# Patient Record
Sex: Female | Born: 1970
Health system: Southern US, Community
[De-identification: ages and names within clinical notes are randomized; demographics above are authoritative.]

## PROBLEM LIST (undated history)

## (undated) DIAGNOSIS — I10 Essential (primary) hypertension: Secondary | ICD-10-CM

## (undated) DIAGNOSIS — M25519 Pain in unspecified shoulder: Secondary | ICD-10-CM

## (undated) DIAGNOSIS — E7800 Pure hypercholesterolemia, unspecified: Secondary | ICD-10-CM

## (undated) DIAGNOSIS — N920 Excessive and frequent menstruation with regular cycle: Secondary | ICD-10-CM

## (undated) DIAGNOSIS — M75121 Complete rotator cuff tear or rupture of right shoulder, not specified as traumatic: Secondary | ICD-10-CM

## (undated) DIAGNOSIS — E669 Obesity, unspecified: Secondary | ICD-10-CM

## (undated) DIAGNOSIS — E785 Hyperlipidemia, unspecified: Secondary | ICD-10-CM

## (undated) HISTORY — DX: Pain in unspecified shoulder: M25.519

## (undated) HISTORY — PX: TUBAL LIGATION: SHX77

## (undated) HISTORY — DX: Obesity, unspecified: E66.9

## (undated) HISTORY — DX: Excessive and frequent menstruation with regular cycle: N92.0

## (undated) HISTORY — PX: ABLATION: SHX5711

## (undated) HISTORY — DX: Hyperlipidemia, unspecified: E78.5

## (undated) HISTORY — PX: ROTATOR CUFF REPAIR: SHX139

---

## 1998-05-29 ENCOUNTER — Ambulatory Visit (HOSPITAL_COMMUNITY): Admission: RE | Admit: 1998-05-29 | Discharge: 1998-05-29 | Payer: Self-pay | Admitting: Obstetrics & Gynecology

## 1998-09-16 ENCOUNTER — Inpatient Hospital Stay (HOSPITAL_COMMUNITY): Admission: AD | Admit: 1998-09-16 | Discharge: 1998-09-16 | Payer: Self-pay | Admitting: *Deleted

## 1998-11-06 ENCOUNTER — Encounter (INDEPENDENT_AMBULATORY_CARE_PROVIDER_SITE_OTHER): Payer: Self-pay

## 1998-11-06 ENCOUNTER — Inpatient Hospital Stay (HOSPITAL_COMMUNITY): Admission: AD | Admit: 1998-11-06 | Discharge: 1998-11-08 | Payer: Self-pay | Admitting: Obstetrics & Gynecology

## 1999-09-23 ENCOUNTER — Other Ambulatory Visit: Admission: RE | Admit: 1999-09-23 | Discharge: 1999-09-23 | Payer: Self-pay | Admitting: Obstetrics and Gynecology

## 2000-01-09 ENCOUNTER — Emergency Department (HOSPITAL_COMMUNITY): Admission: EM | Admit: 2000-01-09 | Discharge: 2000-01-09 | Payer: Self-pay | Admitting: Emergency Medicine

## 2003-05-10 ENCOUNTER — Other Ambulatory Visit: Admission: RE | Admit: 2003-05-10 | Discharge: 2003-05-10 | Payer: Self-pay | Admitting: Family Medicine

## 2005-06-16 ENCOUNTER — Ambulatory Visit: Payer: Self-pay | Admitting: Family Medicine

## 2005-06-23 ENCOUNTER — Ambulatory Visit (HOSPITAL_COMMUNITY): Admission: RE | Admit: 2005-06-23 | Discharge: 2005-06-23 | Payer: Self-pay | Admitting: Family Medicine

## 2005-07-08 ENCOUNTER — Ambulatory Visit: Payer: Self-pay | Admitting: Family Medicine

## 2006-05-18 ENCOUNTER — Ambulatory Visit (HOSPITAL_COMMUNITY): Admission: RE | Admit: 2006-05-18 | Discharge: 2006-05-18 | Payer: Self-pay | Admitting: Obstetrics & Gynecology

## 2006-06-10 ENCOUNTER — Ambulatory Visit (HOSPITAL_COMMUNITY): Admission: RE | Admit: 2006-06-10 | Discharge: 2006-06-10 | Payer: Self-pay | Admitting: Obstetrics & Gynecology

## 2006-06-10 ENCOUNTER — Encounter: Payer: Self-pay | Admitting: Obstetrics & Gynecology

## 2006-07-14 ENCOUNTER — Ambulatory Visit (HOSPITAL_COMMUNITY): Admission: RE | Admit: 2006-07-14 | Discharge: 2006-07-14 | Payer: Self-pay | Admitting: Obstetrics & Gynecology

## 2006-07-20 ENCOUNTER — Encounter: Admission: RE | Admit: 2006-07-20 | Discharge: 2006-07-20 | Payer: Self-pay | Admitting: Obstetrics & Gynecology

## 2007-07-11 ENCOUNTER — Ambulatory Visit (HOSPITAL_COMMUNITY): Admission: RE | Admit: 2007-07-11 | Discharge: 2007-07-11 | Payer: Self-pay | Admitting: Obstetrics & Gynecology

## 2008-01-05 HISTORY — PX: ENDOMETRIAL ABLATION: SHX621

## 2008-12-24 ENCOUNTER — Ambulatory Visit: Payer: Self-pay | Admitting: Family Medicine

## 2008-12-24 ENCOUNTER — Encounter: Payer: Self-pay | Admitting: Family Medicine

## 2008-12-24 DIAGNOSIS — S025XXA Fracture of tooth (traumatic), initial encounter for closed fracture: Secondary | ICD-10-CM | POA: Insufficient documentation

## 2008-12-24 DIAGNOSIS — E669 Obesity, unspecified: Secondary | ICD-10-CM

## 2008-12-24 DIAGNOSIS — H0520 Unspecified exophthalmos: Secondary | ICD-10-CM | POA: Insufficient documentation

## 2008-12-26 ENCOUNTER — Ambulatory Visit (HOSPITAL_COMMUNITY): Admission: RE | Admit: 2008-12-26 | Discharge: 2008-12-26 | Payer: Self-pay | Admitting: Family Medicine

## 2008-12-31 ENCOUNTER — Encounter: Payer: Self-pay | Admitting: Family Medicine

## 2008-12-31 ENCOUNTER — Ambulatory Visit: Payer: Self-pay | Admitting: Family Medicine

## 2008-12-31 LAB — CONVERTED CEMR LAB
CO2: 23 meq/L (ref 19–32)
Calcium: 9.2 mg/dL (ref 8.4–10.5)
Cholesterol: 230 mg/dL — ABNORMAL HIGH (ref 0–200)
Creatinine, Ser: 1.12 mg/dL (ref 0.40–1.20)
HCT: 43.2 % (ref 36.0–46.0)
HDL: 40 mg/dL (ref >39)
MCV: 86.4 fL (ref 78.0–100.0)
Platelets: 283 10*3/uL (ref 150–400)
RDW: 15.2 % (ref 11.5–15.5)
Sodium: 140 meq/L (ref 135–145)
TSH: 0.609 microintl units/mL (ref 0.350–4.500)
Total CHOL/HDL Ratio: 5.8
Triglycerides: 282 mg/dL — ABNORMAL HIGH (ref <150)
WBC: 7.6 10*3/uL (ref 4.0–10.5)

## 2009-01-01 ENCOUNTER — Encounter: Payer: Self-pay | Admitting: Family Medicine

## 2009-01-06 ENCOUNTER — Encounter: Admission: RE | Admit: 2009-01-06 | Discharge: 2009-01-06 | Payer: Self-pay | Admitting: Family Medicine

## 2009-01-24 ENCOUNTER — Ambulatory Visit: Payer: Self-pay | Admitting: Family Medicine

## 2009-01-24 DIAGNOSIS — E781 Pure hyperglyceridemia: Secondary | ICD-10-CM | POA: Insufficient documentation

## 2009-01-29 ENCOUNTER — Encounter: Payer: Self-pay | Admitting: Family Medicine

## 2009-04-17 ENCOUNTER — Emergency Department (HOSPITAL_COMMUNITY): Admission: EM | Admit: 2009-04-17 | Discharge: 2009-04-17 | Payer: Self-pay | Admitting: Emergency Medicine

## 2009-07-07 ENCOUNTER — Emergency Department (HOSPITAL_COMMUNITY): Admission: EM | Admit: 2009-07-07 | Discharge: 2009-07-07 | Payer: Self-pay | Admitting: Emergency Medicine

## 2010-01-25 ENCOUNTER — Encounter: Payer: Self-pay | Admitting: Obstetrics & Gynecology

## 2010-02-03 NOTE — Assessment & Plan Note (Signed)
Summary: fu/kh   Vital Signs:  Patient profile:   40 year old female Height:      66 inches Weight:      201.9 pounds BMI:     32.71 Pulse rate:   80 / minute BP sitting:   130 / 80  (right arm) Cuff size:   large  Vitals Entered By: Arlyss Repress CMA, (January 24, 2009 2:17 PM) CC: BP check, pap Is Patient Diabetic? No Pain Assessment Patient in pain? no        CC:  BP check and pap.  History of Present Illness: Ms. Elrod comes in today for BP check and pap smear and discuss labwork.  C/O neck pain 1) BP - seen as new patient at last visit.  BP slightly high.  Checked one time since that visit and it was 143/88.  Today it is good at 130/80.  Has never had any issues with her BP before.  no headaches, chest pain, shortness of breath. 2) Pap - she was due for pap.  Asked to return for pap and BP check as unable to do during new visit.  1 abnormal pap smear when much younger but never had to go for colpo.  Have always been normal since.  States she had this year's mammogram a week or so ago.  We have no yet received reports 3) Neck pain - was wrestling with her daughter in December and strained her neck.  Overall feeling better but still gets stiffness, spasm at time, usually when trying to sleep or after waking up from sleep.   4) labwork - CBC, BMET, TSH all normal.  Cholesterol borderline high with high triglycerides.  Discussed meanign of this with patient and forms of treatment.   Allergies: 1)  ! Penicillin  Physical Exam  General:  obese, alert, NAD, cooperative Neck:  supple, full ROM, and no masses.   Abdomen:  Bowel sounds positive,abdomen soft and non-tender without masses, organomegaly or hernias noted. Genitalia:  normal introitus, no external lesions, no vaginal discharge, mucosa pink and moist, no vaginal or cervical lesions, no vaginal atrophy, no friaility or hemorrhage, normal uterus size and position, and no adnexal masses or tenderness.     Impression &  Recommendations:  Problem # 1:  ELEVATED BLOOD PRESSURE (ICD-796.2) Assessment Improved NOrmal today.  Will just continue to watch closely.   Problem # 2:  HYPERTRIGLYCERIDEMIA (ICD-272.1) Assessment: New  Discussed cholesterol results, what they mean, how to improve them.  Patient opts to try diet/exercise first.  Will return in 3-4 months to have FLP rechecked.  If still high, would recommend initiating medicine and patient aware and in agreement.   Orders: [facility]- Est  Level 4 (99214)  Problem # 3:  SCREENING FOR MALIGNANT NEOPLASM OF THE CERVIX (ICD-V76.2) Assessment: New Pap obtained.  Orders: Pap Smear-[facility] ([id number]) [facility]- Est  Level 4 (09811) Pap Smear- [facility] (Pap)  Complete Medication List: 1)  Flexeril 5 Mg Tabs (Cyclobenzaprine hcl) .Marland Kitchen.. 1 tab by mouth q 6 hrs as needed neck pain or spasm  Patient Instructions: 1)  It was great to see you again.  Thank you for checking your blood pressure for me.  2)  You blood pressure looks good today. 3)  When I get the results of your pap smear next week I will send you a letter.  4)  I would like to see you again in 3-4 months to recheck your cholesterol and triglycerides again.  If you can come  in 2-3 days before your appointment, in the morning before you eat or drink anything, that would be wonderful.  Then I would have the results for your appointment so we could discuss them.  When you schedule your appointment, just also make a lab appointment for a few days prior.  The lab will have the order for the bloodwork.  5)  Have a great weekend! Prescriptions: FLEXERIL 5 MG TABS (CYCLOBENZAPRINE HCL) 1 tab by mouth q 6 hrs as needed neck pain or spasm  #30 x 1   Entered and Authorized by:   Ardeen Garland  MD   Signed by:   Ardeen Garland  MD on 01/24/2009   Method used:   Print then Give to Patient   RxID:   [id number]

## 2010-02-03 NOTE — Letter (Signed)
Summary: Generic Letter  [facility]  [address]   [place], [place] [address]   Phone: [phone]  Fax: [phone]    01/29/2009  [facility] [address] [place], [place]  [address]  Dear Ms. Migliaccio,   I am happy to inform you that your recent pap smear was normal. If you have any questions, please call our office.         Sincerely,   Ardeen Garland  MD  Appended Document: Generic Letter mailed.

## 2010-02-19 ENCOUNTER — Encounter: Payer: Self-pay | Admitting: *Deleted

## 2010-05-19 NOTE — Op Note (Signed)
Audrey Taylor, Audrey Taylor                 ACCOUNT NO.:  [id number]   MEDICAL RECORD NO.:  [id number]          PATIENT TYPE:  AMB   LOCATION:  [facility]                           FACILITY:  [facility]   PHYSICIAN:  Roseanna Rainbow, M.D.DATE OF BIRTH:  Jan 19, 1970   DATE OF PROCEDURE:  06/10/2006  DATE OF DISCHARGE:                               OPERATIVE REPORT   PREOPERATIVE DIAGNOSIS:  Menorrhagia.   POSTOPERATIVE DIAGNOSIS:  Menorrhagia.  Rule out endometrial polyps  versus hyperplasia.   PROCEDURE:  Dilatation and curettage, diagnostic hysteroscopy and  NovaSure ablation.   SURGEON:  Jackson-Moore.   ANESTHESIA:  General endotracheal.   PATHOLOGY:  Endometrial curettings.   COMPLICATIONS:  None.   FINDINGS:  Upon hysteroscopy, the visualization was limited by a very  lush appearing endometrium.  No discrete lesions were noted.   PROCEDURE:  The patient was taken to the operating room with an IV  running.  She was given general anesthesia and placed in the dorsal  lithotomy position.  She was prepped and draped in the usual sterile  fashion.  A single-tooth tenaculum was then applied to the anterior lip  of the cervix.  The uterus was sounded to 9 cm.  The cervix was dilated  with Shawnie Pons dilators.  The cervical length was 4 cm.  The diagnostic  hysteroscopy was then performed at this time with the above findings  noted.  The hysteroscope was then removed.  The NovaSure device was then  advanced into the uterus and seated.  The intracornual width was noted  to be 4.6.  The intracavitary integrity test was then performed and  passed.  The ablation cycle was then started and completed.  The device  was then removed.  A repeat hysteroscopy post procedure was consistent  with a complete ablation of all of the walls.  The hysteroscope was then  removed.  The single-tooth tenaculum was removed with minimal bleeding  noted from the cervix.  There was no significant deficit noted with  hysteroscopic portion of the procedure.  The patient was then awakened  from general anesthesia and taken to the PACU awake and in stable  condition.      Roseanna Rainbow, M.D.  Electronically Signed     LAJ/MEDQ  D:  06/10/2006  T:  06/10/2006  Job:  [id number]

## 2010-05-19 NOTE — H&P (Signed)
NAMEPOLETTE, Audrey Taylor                 ACCOUNT NO.:  [id number]   MEDICAL RECORD NO.:  [id number]          PATIENT TYPE:  AMB   LOCATION:  SDC                           [facility]   PHYSICIAN:  Roseanna Rainbow, M.D.DATE OF BIRTH:  [birth year]   DATE OF ADMISSION:  DATE OF DISCHARGE:                              HISTORY & PHYSICAL   CHIEF COMPLAINT:  The patient is a 40 year old complaining of abnormal  uterine bleeding, who presents for a D&C and NovaSure ablation.   HISTORY OF PRESENT ILLNESS:  The patient gives a 52-month history of  heavier bleeding with menses and clotting.  She denies any previous  diagnosis of uterine pathology.  Work-up to date has included a normal  pelvic ultrasound.   PAST MEDICAL HISTORY:  Noncontributory.   SOCIAL HISTORY:  Married.  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  Has no  significant smoking history.  Does not give any significant history of  alcohol usage.  Denies illicit drug use.   FAMILY HISTORY:  She denies.   PAST GYN HISTORY:  She has a history of a bilateral tubal ligation  postpartum.   PAST OBSTETRICAL HISTORY:  She has been pregnant 2 times.  She has 2  living children.   REVIEW OF SYSTEMS:  GU:  Please see the above.   PHYSICAL EXAMINATION:  VITAL SIGNS:  Temperature 97.8, pulse 77, blood  pressure 148/74, weight 196 pounds.  GENERAL:  Well-developed, well-nourished female, no apparent distress.  LUNGS:  Clear to auscultation bilaterally.  HEART:  Regular rate and rhythm.  ABDOMEN:  Soft, nontender without masses.  PELVIC:  The external female genitalia are normal-appearing on speculum  exam.  The vaginal mucosa is clear without lesions, no discharge.  The  cervix is clear, firm and closed.  No visible lesions.  On bimanual  exam, the uterus is mildly enlarged.  The position is retroverted.  The  adnexa are without masses or organomegaly or local guarding.   ASSESSMENT:  Menorrhagia.   PLAN:  The planned procedure is a D&C  and NovaSure ablation.  The risks,  benefits, and alternative forms of management were reviewed with the  patient, and informed consent had been obtained.      Roseanna Rainbow, M.D.  Electronically Signed     LAJ/MEDQ  D:  06/09/2006  T:  06/09/2006  Job:  [id number]

## 2010-10-22 LAB — CBC
HCT: 40.8
Hemoglobin: 13.4
MCHC: 32.8
MCV: 82.7
RBC: 4.94
RDW: 15.7 — ABNORMAL HIGH

## 2011-01-20 ENCOUNTER — Ambulatory Visit: Payer: Self-pay

## 2011-05-28 ENCOUNTER — Encounter (HOSPITAL_COMMUNITY): Payer: Self-pay | Admitting: Anesthesiology

## 2011-05-28 ENCOUNTER — Emergency Department (INDEPENDENT_AMBULATORY_CARE_PROVIDER_SITE_OTHER)
Admission: EM | Admit: 2011-05-28 | Discharge: 2011-05-28 | Disposition: A | Discharge status: Nursing Home (Includes ICF) | Payer: 59 | Source: Home / Self Care | Attending: Family Medicine | Admitting: Family Medicine

## 2011-05-28 ENCOUNTER — Emergency Department (INDEPENDENT_AMBULATORY_CARE_PROVIDER_SITE_OTHER): Payer: 59

## 2011-05-28 ENCOUNTER — Encounter (HOSPITAL_COMMUNITY): Payer: Self-pay | Admitting: *Deleted

## 2011-05-28 ENCOUNTER — Emergency Department (HOSPITAL_COMMUNITY)
Admission: EM | Admit: 2011-05-28 | Discharge: 2011-05-28 | Disposition: A | Discharge status: Home / Self Care | Payer: 59 | Attending: Emergency Medicine | Admitting: Emergency Medicine

## 2011-05-28 ENCOUNTER — Encounter (HOSPITAL_COMMUNITY): Payer: Self-pay | Admitting: Emergency Medicine

## 2011-05-28 ENCOUNTER — Encounter (HOSPITAL_COMMUNITY)
Admission: EM | Disposition: A | Discharge status: Home / Self Care | Payer: Self-pay | Source: Home / Self Care | Attending: Emergency Medicine

## 2011-05-28 ENCOUNTER — Emergency Department (HOSPITAL_COMMUNITY): Payer: 59 | Admitting: Anesthesiology

## 2011-05-28 DIAGNOSIS — W319XXA Contact with unspecified machinery, initial encounter: Secondary | ICD-10-CM | POA: Insufficient documentation

## 2011-05-28 DIAGNOSIS — T23269A Burn of second degree of back of unspecified hand, initial encounter: Secondary | ICD-10-CM

## 2011-05-28 DIAGNOSIS — S62639B Displaced fracture of distal phalanx of unspecified finger, initial encounter for open fracture: Secondary | ICD-10-CM

## 2011-05-28 DIAGNOSIS — S61209A Unspecified open wound of unspecified finger without damage to nail, initial encounter: Secondary | ICD-10-CM | POA: Insufficient documentation

## 2011-05-28 DIAGNOSIS — S6710XA Crushing injury of unspecified finger(s), initial encounter: Secondary | ICD-10-CM | POA: Insufficient documentation

## 2011-05-28 DIAGNOSIS — Y990 Civilian activity done for income or pay: Secondary | ICD-10-CM | POA: Insufficient documentation

## 2011-05-28 DIAGNOSIS — M7989 Other specified soft tissue disorders: Secondary | ICD-10-CM | POA: Insufficient documentation

## 2011-05-28 DIAGNOSIS — T23062A Burn of unspecified degree of back of left hand, initial encounter: Secondary | ICD-10-CM

## 2011-05-28 DIAGNOSIS — Z23 Encounter for immunization: Secondary | ICD-10-CM

## 2011-05-28 DIAGNOSIS — S6000XA Contusion of unspecified finger without damage to nail, initial encounter: Secondary | ICD-10-CM | POA: Insufficient documentation

## 2011-05-28 DIAGNOSIS — M79609 Pain in unspecified limb: Secondary | ICD-10-CM | POA: Insufficient documentation

## 2011-05-28 DIAGNOSIS — T23069A Burn of unspecified degree of back of unspecified hand, initial encounter: Secondary | ICD-10-CM

## 2011-05-28 HISTORY — PX: I&D EXTREMITY: SHX5045

## 2011-05-28 SURGERY — IRRIGATION AND DEBRIDEMENT EXTREMITY
Anesthesia: General | Site: Finger | Laterality: Right | Wound class: Dirty or Infected

## 2011-05-28 MED ORDER — SUCCINYLCHOLINE CHLORIDE 20 MG/ML IJ SOLN
INTRAMUSCULAR | Status: DC | PRN
  Administered 2011-05-28: 150 mg via INTRAVENOUS

## 2011-05-28 MED ORDER — ONDANSETRON HCL 4 MG/2ML IJ SOLN
4.0000 mg | Freq: Once | INTRAMUSCULAR | Status: AC
  Administered 2011-05-28: 4 mg via INTRAMUSCULAR

## 2011-05-28 MED ORDER — HYDROMORPHONE HCL PF 1 MG/ML IJ SOLN
INTRAMUSCULAR | Status: AC
  Filled 2011-05-28: qty 2

## 2011-05-28 MED ORDER — FENTANYL CITRATE 0.05 MG/ML IJ SOLN
INTRAMUSCULAR | Status: DC | PRN
  Administered 2011-05-28 (×2): 100 ug via INTRAVENOUS

## 2011-05-28 MED ORDER — LIDOCAINE HCL (CARDIAC) 20 MG/ML IV SOLN
INTRAVENOUS | Status: DC | PRN
  Administered 2011-05-28: 100 mg via INTRAVENOUS

## 2011-05-28 MED ORDER — HYDROMORPHONE HCL PF 1 MG/ML IJ SOLN
2.0000 mg | Freq: Once | INTRAMUSCULAR | Status: AC
  Administered 2011-05-28: 2 mg via INTRAMUSCULAR

## 2011-05-28 MED ORDER — ONDANSETRON HCL 4 MG/2ML IJ SOLN: 4.0000 mg | Freq: Once | INTRAMUSCULAR | Status: DC | PRN

## 2011-05-28 MED ORDER — TETANUS-DIPHTH-ACELL PERTUSSIS 5-2.5-18.5 LF-MCG/0.5 IM SUSP
INTRAMUSCULAR | Status: AC
  Filled 2011-05-28: qty 0.5

## 2011-05-28 MED ORDER — PROPOFOL 10 MG/ML IV EMUL
INTRAVENOUS | Status: DC | PRN
  Administered 2011-05-28: 120 mg via INTRAVENOUS

## 2011-05-28 MED ORDER — TETANUS-DIPHTH-ACELL PERTUSSIS 5-2.5-18.5 LF-MCG/0.5 IM SUSP
0.5000 mL | Freq: Once | INTRAMUSCULAR | Status: AC
  Administered 2011-05-28: 0.5 mL via INTRAMUSCULAR

## 2011-05-28 MED ORDER — SODIUM CHLORIDE 0.9 % IR SOLN
Status: DC | PRN
  Administered 2011-05-28: 1000 mL

## 2011-05-28 MED ORDER — HYDROMORPHONE HCL PF 1 MG/ML IJ SOLN
INTRAMUSCULAR | Status: AC
  Filled 2011-05-28: qty 1

## 2011-05-28 MED ORDER — LACTATED RINGERS IV SOLN
INTRAVENOUS | Status: DC | PRN
  Administered 2011-05-28: 20:00:00 via INTRAVENOUS

## 2011-05-28 MED ORDER — HYDROMORPHONE HCL PF 1 MG/ML IJ SOLN
0.2500 mg | INTRAMUSCULAR | Status: DC | PRN
  Administered 2011-05-28: 0.5 mg via INTRAVENOUS

## 2011-05-28 MED ORDER — ONDANSETRON HCL 4 MG/2ML IJ SOLN
INTRAMUSCULAR | Status: DC | PRN
  Administered 2011-05-28: 4 mg via INTRAVENOUS

## 2011-05-28 MED ORDER — CHLORHEXIDINE GLUCONATE 4 % EX LIQD
60.0000 mL | Freq: Once | CUTANEOUS | Status: DC
  Filled 2011-05-28: qty 60

## 2011-05-28 MED ORDER — ONDANSETRON HCL 4 MG/2ML IJ SOLN
INTRAMUSCULAR | Status: AC
  Filled 2011-05-28: qty 2

## 2011-05-28 MED ORDER — CEFAZOLIN SODIUM 1-5 GM-% IV SOLN
1.0000 g | INTRAVENOUS | Status: AC
  Administered 2011-05-28: 1 g via INTRAVENOUS
  Filled 2011-05-28: qty 50

## 2011-05-28 MED ORDER — BUPIVACAINE HCL 0.25 % IJ SOLN
INTRAMUSCULAR | Status: DC | PRN
  Administered 2011-05-28: 3 mL

## 2011-05-28 MED ORDER — OXYCODONE-ACETAMINOPHEN 5-325 MG PO TABS: 1.0000 | ORAL_TABLET | ORAL | Status: AC | PRN

## 2011-05-28 MED ORDER — MIDAZOLAM HCL 5 MG/5ML IJ SOLN
INTRAMUSCULAR | Status: DC | PRN
  Administered 2011-05-28: 2 mg via INTRAVENOUS

## 2011-05-28 SURGICAL SUPPLY — 51 items
BANDAGE CONFORM 2  STR LF (GAUZE/BANDAGES/DRESSINGS) IMPLANT
BANDAGE ELASTIC 3 VELCRO ST LF (GAUZE/BANDAGES/DRESSINGS) IMPLANT
BANDAGE ELASTIC 4 VELCRO ST LF (GAUZE/BANDAGES/DRESSINGS) IMPLANT
BANDAGE GAUZE ELAST BULKY 4 IN (GAUZE/BANDAGES/DRESSINGS) IMPLANT
BNDG CMPR 9X4 STRL LF SNTH (GAUZE/BANDAGES/DRESSINGS) ×1
BNDG COHESIVE 1X5 TAN STRL LF (GAUZE/BANDAGES/DRESSINGS) ×2 IMPLANT
BNDG ESMARK 4X9 LF (GAUZE/BANDAGES/DRESSINGS) ×2 IMPLANT
CLOTH BEACON ORANGE TIMEOUT ST (SAFETY) ×2 IMPLANT
CORDS BIPOLAR (ELECTRODE) IMPLANT
COVER SURGICAL LIGHT HANDLE (MISCELLANEOUS) ×2 IMPLANT
CUFF TOURNIQUET SINGLE 18IN (TOURNIQUET CUFF) ×2 IMPLANT
DECANTER SPIKE VIAL GLASS SM (MISCELLANEOUS) IMPLANT
DRAIN PENROSE 1/4X12 LTX STRL (WOUND CARE) IMPLANT
DRAPE OEC MINIVIEW 54X84 (DRAPES) IMPLANT
DRAPE SURG 17X23 STRL (DRAPES) ×2 IMPLANT
DRSG PAD ABDOMINAL 8X10 ST (GAUZE/BANDAGES/DRESSINGS) IMPLANT
DURAPREP 26ML APPLICATOR (WOUND CARE) IMPLANT
ELECT REM PT RETURN 9FT ADLT (ELECTROSURGICAL)
ELECTRODE REM PT RTRN 9FT ADLT (ELECTROSURGICAL) IMPLANT
GAUZE PACKING IODOFORM 1/4X5 (PACKING) IMPLANT
GAUZE XEROFORM 1X8 LF (GAUZE/BANDAGES/DRESSINGS) ×2 IMPLANT
GLOVE BIO SURGEON STRL SZ8.5 (GLOVE) ×2 IMPLANT
GOWN SRG XL XLNG 56XLVL 4 (GOWN DISPOSABLE) IMPLANT
GOWN STRL NON-REIN LRG LVL3 (GOWN DISPOSABLE) IMPLANT
GOWN STRL NON-REIN XL XLG LVL4 (GOWN DISPOSABLE)
HANDPIECE INTERPULSE COAX TIP (DISPOSABLE)
KIT BASIN OR (CUSTOM PROCEDURE TRAY) ×2 IMPLANT
KIT ROOM TURNOVER OR (KITS) ×2 IMPLANT
MANIFOLD NEPTUNE II (INSTRUMENTS) ×2 IMPLANT
NEEDLE HYPO 25GX1X1/2 BEV (NEEDLE) ×2 IMPLANT
NEEDLE HYPO 25X1 1.5 SAFETY (NEEDLE) ×2 IMPLANT
NS IRRIG 1000ML POUR BTL (IV SOLUTION) ×2 IMPLANT
PACK ORTHO EXTREMITY (CUSTOM PROCEDURE TRAY) ×2 IMPLANT
PAD ARMBOARD 7.5X6 YLW CONV (MISCELLANEOUS) ×4 IMPLANT
PAD CAST 4YDX4 CTTN HI CHSV (CAST SUPPLIES) IMPLANT
PADDING CAST COTTON 4X4 STRL (CAST SUPPLIES)
SET HNDPC FAN SPRY TIP SCT (DISPOSABLE) IMPLANT
SPONGE GAUZE 4X4 12PLY (GAUZE/BANDAGES/DRESSINGS) ×2 IMPLANT
SPONGE LAP 18X18 X RAY DECT (DISPOSABLE) IMPLANT
SUCTION FRAZIER TIP 10 FR DISP (SUCTIONS) IMPLANT
SUT CHROMIC 6 0 PS 4 (SUTURE) ×2 IMPLANT
SUT VICRYL RAPIDE 4/0 PS 2 (SUTURE) ×2 IMPLANT
SYR 20CC LL (SYRINGE) IMPLANT
SYR CONTROL 10ML LL (SYRINGE) ×2 IMPLANT
TOWEL OR 17X24 6PK STRL BLUE (TOWEL DISPOSABLE) IMPLANT
TOWEL OR 17X26 10 PK STRL BLUE (TOWEL DISPOSABLE) IMPLANT
TUBE ANAEROBIC SPECIMEN COL (MISCELLANEOUS) IMPLANT
TUBE CONNECTING 12X1/4 (SUCTIONS) ×2 IMPLANT
UNDERPAD 30X30 INCONTINENT (UNDERPADS AND DIAPERS) ×2 IMPLANT
WATER STERILE IRR 1000ML POUR (IV SOLUTION) IMPLANT
YANKAUER SUCT BULB TIP NO VENT (SUCTIONS) IMPLANT

## 2011-05-28 NOTE — Brief Op Note (Signed)
05/28/2011  8:47 PM  PATIENT:  Audrey Taylor  41 y.o. female  PRE-OPERATIVE DIAGNOSIS:  Laceration of Right index and long finger  POST-OPERATIVE DIAGNOSIS:  Laceration Right Index and Long Fingers  PROCEDURE:  Procedure(s) (LRB): IRRIGATION AND DEBRIDEMENT EXTREMITY (Right)  SURGEON:  Surgeon(s) and Role:    * Marlowe Shores, MD - Primary  PHYSICIAN ASSISTANT:   ASSISTANTS: none   ANESTHESIA:   general  EBL:  Total I/O In: -  Out: 50 [Other:50]  BLOOD ADMINISTERED:none  DRAINS: none   LOCAL MEDICATIONS USED:  MARCAINE   6cc  SPECIMEN:  No Specimen  DISPOSITION OF SPECIMEN:  N/A  COUNTS:  YES  TOURNIQUET:   Total Tourniquet Time Documented: Upper Arm (Right) - 24 minutes  DICTATION: .Other Dictation: Dictation Number [id number]  PLAN OF CARE: Discharge to home after PACU  PATIENT DISPOSITION:  PACU - hemodynamically stable.   Delay start of Pharmacological VTE agent (>24hrs) due to surgical blood loss or risk of bleeding: not applicable

## 2011-05-28 NOTE — ED Notes (Signed)
Lunch at work is 12-1.  Incident occurred when patient returned to work station from lunch

## 2011-05-28 NOTE — Anesthesia Procedure Notes (Signed)
Procedure Name: Intubation Date/Time: 05/28/2011 8:08 PM Performed by: Jefm Miles E Pre-anesthesia Checklist: Patient identified, Emergency Drugs available, Suction available, Patient being monitored and Timeout performed Patient Re-evaluated:Patient Re-evaluated prior to inductionOxygen Delivery Method: Circle system utilized Preoxygenation: Pre-oxygenation with 100% oxygen Intubation Type: IV induction, Rapid sequence and Cricoid Pressure applied Laryngoscope Size: Mac and 3 Grade View: Grade I Tube type: Oral Tube size: 7.0 mm Number of attempts: 1 Airway Equipment and Method: Stylet Placement Confirmation: ETT inserted through vocal cords under direct vision,  breath sounds checked- equal and bilateral and positive ETCO2 Secured at: 21 cm Tube secured with: Tape Dental Injury: Teeth and Oropharynx as per pre-operative assessment
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
## 2011-05-28 NOTE — Preoperative (Signed)
Beta Blockers   Reason not to administer Beta Blockers:Not Applicable 
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
## 2011-05-28 NOTE — Op Note (Signed)
See dictated note [phone]

## 2011-05-28 NOTE — Anesthesia Preprocedure Evaluation (Addendum)
Anesthesia Evaluation  Patient identified by MRN, date of birth, ID band Patient awake    Reviewed: Allergy & Precautions, H&P , NPO status   Airway Mallampati: II TM Distance: >3 FB Neck ROM: Full    Dental  (+) Teeth Intact and Dental Advisory Given   Pulmonary          Cardiovascular     Neuro/Psych    GI/Hepatic   Endo/Other    Renal/GU      Musculoskeletal   Abdominal   Peds  Hematology   Anesthesia Other Findings   Reproductive/Obstetrics                           Anesthesia Physical Anesthesia Plan  ASA: II  Anesthesia Plan: General   Post-op Pain Management:    Induction: Intravenous  Airway Management Planned: Oral ETT  Additional Equipment:   Intra-op Plan:   Post-operative Plan: Extubation in OR  Informed Consent: I have reviewed the patients History and Physical, chart, labs and discussed the procedure including the risks, benefits and alternatives for the proposed anesthesia with the patient or authorized representative who has indicated his/her understanding and acceptance.     Plan Discussed with: CRNA and Surgeon  Anesthesia Plan Comments:         Anesthesia Quick Evaluation
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
## 2011-05-28 NOTE — Transfer of Care (Signed)
Immediate Anesthesia Transfer of Care Note  Patient: Audrey Taylor  Procedure(s) Performed: Procedure(s) (LRB): IRRIGATION AND DEBRIDEMENT EXTREMITY (Right)  Patient Location: PACU  Anesthesia Type: General  Level of Consciousness: patient cooperative, lethargic and responds to stimulation  Airway & Oxygen Therapy: Patient Spontanous Breathing and Patient connected to nasal cannula oxygen  Post-op Assessment: Report given to PACU RN  Post vital signs: Reviewed and stable  Complications: No apparent anesthesia complications
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
## 2011-05-28 NOTE — ED Notes (Signed)
Hand surgeon came to see the patient

## 2011-05-28 NOTE — Discharge Instructions (Signed)
Finger Fracture Fractures of fingers are breaks in the bones of the fingers. There are many types of fractures. There are different ways of treating these fractures, all of which can be correct. Your caregiver will discuss the best way to treat your fracture. TREATMENT  Finger fractures can be treated with:   Non-reduction - this means the bones are in place. The finger is splinted without changing the positions of the bone pieces. The splint is usually left on for about a week to ten days. This will depend on your fracture and what your caregiver thinks.   Closed reduction - the bones are put back into position without using surgery. The finger is then splinted.   ORIF (open reduction and internal fixation) - the fracture site is opened. Then the bone pieces are fixed into place with pins or some type of hardware. This is seldom required. It depends on the severity of the fracture.  Your caregiver will discuss the type of fracture you have and the treatment that will be best for that problem. If surgery is the treatment of choice, the following is information for you to know and also let your caregiver know about prior to surgery. LET YOUR CAREGIVER KNOW ABOUT:  Allergies   Medications taken including herbs, eye drops, over the counter medications, and creams   Use of steroids (by mouth or creams)   Previous problems with anesthetics or Novocaine   Possibility of pregnancy, if this applies   History of blood clots (thrombophlebitis)   History of bleeding or blood problems   Previous surgery   Other health problems  AFTER THE PROCEDURE After surgery, you will be taken to the recovery area where a nurse will check your progress. Once you're awake, stable, and taking fluids well, barring other problems you will be allowed to go home. Once home an ice pack applied to your operative site may help with discomfort and keep the swelling down. HOME CARE INSTRUCTIONS   Follow your  caregiver's instructions as to activities, exercises, physical therapy, and driving a car.   Use your finger and exercise as directed.   Only take over-the-counter or prescription medicines for pain, discomfort, or fever as directed by your caregiver. Do not take aspirin until your caregiver OK's it, as this can increase bleeding immediately following surgery.   Stop using ibuprofen if it upsets your stomach. Let your caregiver know about it.  SEEK MEDICAL CARE IF:  You have increased bleeding (more than a small spot) from the wound or from beneath your splint.   You develop redness, swelling, or increasing pain in the wound or from beneath your splint.   There is pus coming from the wound or from beneath your splint.   An unexplained oral temperature above 102 F (38.9 C) develops, or as your caregiver suggests.   There is a foul smell coming from the wound or dressing or from beneath your splint.  SEEK IMMEDIATE MEDICAL CARE IF:   You develop a rash.   You have difficulty breathing.   You have any allergic problems.  MAKE SURE YOU:   Understand these instructions.   Will watch your condition.   Will get help right away if you are not doing well or get worse.  Document Released: 04/04/2000 Document Revised: 12/10/2010 Document Reviewed: 08/10/2007 Exit[employer] Patient Information 2012 [employer]. 

## 2011-05-28 NOTE — ED Notes (Signed)
[employer] is employer

## 2011-05-28 NOTE — Anesthesia Postprocedure Evaluation (Signed)
Anesthesia Post Note  Patient: Audrey Taylor  Procedure(s) Performed: Procedure(s) (LRB): IRRIGATION AND DEBRIDEMENT EXTREMITY (Right)  Anesthesia type: general  Patient location: PACU  Post pain: Pain level controlled  Post assessment: Patient's Cardiovascular Status Stable  Last Vitals:  Filed Vitals:   05/28/11 2100  BP:   Pulse: 85  Temp: 36.1 C  Resp: 21    Post vital signs: Reviewed and stable  Level of consciousness: sedated  Complications: No apparent anesthesia complications

## 2011-05-31 MED FILL — Vancomycin HCl For IV Soln 1 GM (Base Equivalent): INTRAVENOUS | Qty: 1000 | Status: AC

## 2011-06-01 ENCOUNTER — Encounter (HOSPITAL_COMMUNITY): Payer: Self-pay | Admitting: Orthopedic Surgery

## 2011-06-01 NOTE — Op Note (Signed)
Audrey Taylor, Audrey Taylor                 ACCOUNT NO.:  [id number]  MEDICAL RECORD NO.:  [id number]  LOCATION:                                 FACILITY:  PHYSICIAN:  Artist Pais. Trebor Galdamez, M.D.DATE OF BIRTH:  09-Jul-1970  DATE OF PROCEDURE:  05/28/2011 DATE OF DISCHARGE:                              OPERATIVE REPORT   PREOPERATIVE DIAGNOSIS:  Crush injury, right index and right long finger.  POSTOPERATIVE DIAGNOSIS:  Crush injury, right index and right long finger.  PROCEDURE:  I and D above with repair of nail bed.  Open distal phalangeal fracture and extensor tendon laceration, right long finger as well as closed treatment of distal phalangeal fracture, right index finger.  SURGEON:  Artist Pais. Mina Marble, MD  ASSISTANT:  None.  ANESTHESIA:  General.  No complication and drains.  The patient was taken to the operating suite.  After induction of general anesthesia, right upper extremity was prepped and draped in sterile fashion.  An Esmarch was used to exsanguinate the limb. Tourniquet was inflated to 250 mmHg.  At this point in time, right upper extremity was approached surgically.  There was a transverse nail plate fracture of the right index finger with a nail bed underneath had minimal subungual hematoma.  This was irrigated and the long finger was then approached.  There was a avulsion of the nail plate from under the eponychial fold, and a complex nail bed laceration, open distal phalangeal fracture, and partial laceration to the extensor tendon.  The wound was thoroughly irrigated.  Debrided of clot.  The skin was repaired with 4-0 Vicryl Rapide to realign the tip followed by repair of the complex nail bed laceration and open treatment of the distal phalangeal fracture.  This was accomplished with 6-0 chromic.  The nail plate was then placed back under the eponychial fold and sutured in place with a 4-0 Vicryl Rapide suture.  The extensor tendon was repaired with 4-0 Vicryl  Rapide as well.  At this point in time, a Xeroform, 4x4s, and Coban wrap was applied to both fingers.  The patient tolerated procedures well and went to the recovery room in stable fashion.     Artist Pais Mina Marble, M.D.     MAW/MEDQ  D:  05/28/2011  T:  05/29/2011  Job:  [id number]

## 2013-04-03 ENCOUNTER — Ambulatory Visit: Payer: No Typology Code available for payment source | Attending: Internal Medicine

## 2013-05-21 ENCOUNTER — Ambulatory Visit: Payer: No Typology Code available for payment source | Attending: Internal Medicine | Admitting: Internal Medicine

## 2013-05-21 ENCOUNTER — Encounter: Payer: Self-pay | Admitting: Internal Medicine

## 2013-05-21 VITALS — BP 127/83 | HR 81 | Temp 97.9°F | Resp 16 | Ht 67.0 in | Wt 216.6 lb

## 2013-05-21 DIAGNOSIS — R109 Unspecified abdominal pain: Secondary | ICD-10-CM | POA: Insufficient documentation

## 2013-05-21 DIAGNOSIS — R102 Pelvic and perineal pain: Secondary | ICD-10-CM

## 2013-05-21 DIAGNOSIS — Z0000 Encounter for general adult medical examination without abnormal findings: Secondary | ICD-10-CM

## 2013-05-21 DIAGNOSIS — N949 Unspecified condition associated with female genital organs and menstrual cycle: Secondary | ICD-10-CM | POA: Insufficient documentation

## 2013-05-21 DIAGNOSIS — M25519 Pain in unspecified shoulder: Secondary | ICD-10-CM | POA: Insufficient documentation

## 2013-05-21 MED ORDER — MELOXICAM 7.5 MG PO TABS: 7.5000 mg | ORAL_TABLET | Freq: Every day | ORAL | Status: DC

## 2013-05-21 NOTE — Patient Instructions (Signed)
DASH Diet  The DASH diet stands for "Dietary Approaches to Stop Hypertension." It is a healthy eating plan that has been shown to reduce high blood pressure (hypertension) in as little as 14 days, while also possibly providing other significant health benefits. These other health benefits include reducing the risk of breast cancer after menopause and reducing the risk of type 2 diabetes, heart disease, colon cancer, and stroke. Health benefits also include weight loss and slowing kidney failure in patients with chronic kidney disease.   DIET GUIDELINES  · Limit salt (sodium). Your diet should contain less than 1500 mg of sodium daily.  · Limit refined or processed carbohydrates. Your diet should include mostly whole grains. Desserts and added sugars should be used sparingly.  · Include small amounts of heart-healthy fats. These types of fats include nuts, oils, and tub margarine. Limit saturated and trans fats. These fats have been shown to be harmful in the body.  CHOOSING FOODS   The following food groups are based on a 2000 calorie diet. See your Registered Dietitian for individual calorie needs.  Grains and Grain Products (6 to 8 servings daily)  · Eat More Often: Whole-wheat bread, brown rice, whole-grain or wheat pasta, quinoa, popcorn without added fat or salt (air popped).  · Eat Less Often: White bread, white pasta, white rice, cornbread.  Vegetables (4 to 5 servings daily)  · Eat More Often: Fresh, frozen, and canned vegetables. Vegetables may be raw, steamed, roasted, or grilled with a minimal amount of fat.  · Eat Less Often/Avoid: Creamed or fried vegetables. Vegetables in a cheese sauce.  Fruit (4 to 5 servings daily)  · Eat More Often: All fresh, canned (in natural juice), or frozen fruits. Dried fruits without added sugar. One hundred percent fruit juice (½ cup [237 mL] daily).  · Eat Less Often: Dried fruits with added sugar. Canned fruit in light or heavy syrup.  Lean Meats, Fish, and Poultry (2  servings or less daily. One serving is 3 to 4 oz [85-114 g]).  · Eat More Often: Ninety percent or leaner ground beef, tenderloin, sirloin. Round cuts of beef, chicken breast, turkey breast. All fish. Grill, bake, or broil your meat. Nothing should be fried.  · Eat Less Often/Avoid: Fatty cuts of meat, turkey, or chicken leg, thigh, or wing. Fried cuts of meat or fish.  Dairy (2 to 3 servings)  · Eat More Often: Low-fat or fat-free milk, low-fat plain or light yogurt, reduced-fat or part-skim cheese.  · Eat Less Often/Avoid: Milk (whole, 2%). Whole milk yogurt. Full-fat cheeses.  Nuts, Seeds, and Legumes (4 to 5 servings per week)  · Eat More Often: All without added salt.  · Eat Less Often/Avoid: Salted nuts and seeds, canned beans with added salt.  Fats and Sweets (limited)  · Eat More Often: Vegetable oils, tub margarines without trans fats, sugar-free gelatin. Mayonnaise and salad dressings.  · Eat Less Often/Avoid: Coconut oils, palm oils, butter, stick margarine, cream, half and half, cookies, candy, pie.  FOR MORE INFORMATION  The Dash Diet Eating Plan: www.dashdiet.org  Document Released: 12/10/2010 Document Revised: 03/15/2011 Document Reviewed: 12/10/2010  ExitCare® Patient Information ©2014 [employer].

## 2013-05-21 NOTE — Progress Notes (Signed)
Patient is here to establish care. States 3-4 year history of bilateral shoulder pain with limited ROM States 14 year history of sensation of "insides coming out" through vagina. States last gyn exam was 3-4 years ago.

## 2013-05-22 ENCOUNTER — Encounter: Payer: Self-pay | Admitting: Obstetrics & Gynecology

## 2013-05-22 ENCOUNTER — Other Ambulatory Visit: Payer: Self-pay | Admitting: Internal Medicine

## 2013-05-22 DIAGNOSIS — Z1231 Encounter for screening mammogram for malignant neoplasm of breast: Secondary | ICD-10-CM

## 2013-05-22 NOTE — Progress Notes (Signed)
Patient ID: Audrey Taylor, female   DOB: 05/15/1970, 43 y.o.   MRN: [id number]  NFA:[id number]  [id number]  DOB - 11/01/1970  CC:  Chief Complaint  Patient presents with  . Establish Care  . Shoulder Pain       HPI: Vertis KelchMicah Taylor is a 43 y.o. female here today to establish medical care.  Sincerely today to establish care and to be evaluated for ongoing bilateral shoulder pain for the past 4 years.  Towards the pain is achy and constant. She denies any redness, warmth, or swelling of the joints.  He also presents with a 14 year history (since birth of last child) of vaginal pain. Patient reports that the pain feels like a pulling sensation in her vagina.  Reports the pain often feels like labor pain.  She reports a throbbing sensation in her vagina when she voids.  Denies all urinary tract infection symptoms.     Allergies  Allergen Reactions  . Penicillins Other (See Comments)    Childhood allergy   No past medical history on file. No current outpatient prescriptions on file prior to visit.   No current facility-administered medications on file prior to visit.   No family history on file. History   Social History  . Marital Status: Married    Spouse Name: N/A    Number of Children: N/A  . Years of Education: N/A   Occupational History  . Not on file.   Social History Main Topics  . Smoking status: Never Smoker   . Smokeless tobacco: Not on file  . Alcohol Use: No  . Drug Use: No  . Sexual Activity:    Other Topics Concern  . Not on file   Social History Narrative  . No narrative on file   Review of Systems  Constitutional: Negative for fever and chills.  HENT: Negative.   Eyes: Negative.   Respiratory: Negative.   Cardiovascular: Negative.   Gastrointestinal: Positive for abdominal pain. Negative for nausea and vomiting.  Genitourinary: Negative for dysuria, urgency, frequency, hematuria and flank pain.  Musculoskeletal:       Bilateral shoulder pain     Neurological: Negative.   Endo/Heme/Allergies: Negative.   Psychiatric/Behavioral: Negative.       Objective:   Filed Vitals:   05/21/13 1515  BP: 127/83  Pulse: 81  Temp: 97.9 F (36.6 C)  Resp: 16    Physical Exam: Constitutional: Patient appears well-developed and well-nourished. No distress. HENT: Normocephalic, atraumatic, External right and left ear normal. Oropharynx is clear and moist.  Eyes: Conjunctivae and EOM are normal. PERRLA, no scleral icterus. Neck: Normal ROM. Neck supple. No JVD. No tracheal deviation. No thyromegaly. CVS: RRR, S1/S2 +, no murmurs, no gallops, no carotid bruit.  Pulmonary: Effort and breath sounds normal, no stridor, rhonchi, wheezes, rales.  Abdominal: Soft. BS +, no distension, tenderness, rebound or guarding.  Musculoskeletal: Crepitus felt with bilateral shoulder joints with passive range of motion. Pain with shoulder ROM. No edema and no tenderness.  Lymphadenopathy: No lymphadenopathy noted, cervical Neuro: Alert. Normal reflexes, muscle tone coordination. No cranial nerve deficit. Skin: Skin is warm and dry. No rash noted. Not diaphoretic. No erythema. No pallor. Psychiatric: Normal mood and affect. Behavior, judgment, thought content normal.  Lab Results  Component Value Date   WBC 7.6 12/31/2008   HGB 14.1 12/31/2008   HCT 43.2 12/31/2008   MCV 86.4 12/31/2008   PLT 283 12/31/2008   Lab Results  Component Value Date  CREATININE 1.12 12/31/2008   BUN 11 12/31/2008   NA 140 12/31/2008   K 4.4 12/31/2008   CL 107 12/31/2008   CO2 23 12/31/2008    No results found for this basename: HGBA1C   Lipid Panel     Component Value Date/Time   CHOL 230* 12/31/2008 2013   TRIG 282* 12/31/2008 2013   HDL 40 12/31/2008 2013   CHOLHDL 5.8 Ratio 12/31/2008 2013   VLDL 56* 12/31/2008 2013   LDLCALC 134* 12/31/2008 2013       Assessment and plan:   Noralee was seen today for establish care and shoulder pain.  Diagnoses and  associated orders for this visit:  Vaginal pain - Ambulatory referral to Gynecology to rule out prolapse.  Preventative health care - MM DIGITAL SCREENING BILATERAL; Future  Pain in joint, shoulder region - meloxicam (MOBIC) 7.5 MG tablet; Take 1 tablet (7.5 mg total) by mouth daily.   Return in about 1 week (around 05/28/2013), or if symptoms worsen or fail to improve, for Lab Visit.      Holland CommonsValerie Garnette Greb, NP-C [facility] [phone] 05/22/2013, 9:36 PM

## 2013-05-29 ENCOUNTER — Ambulatory Visit: Payer: No Typology Code available for payment source | Attending: Internal Medicine

## 2013-05-29 DIAGNOSIS — Z0000 Encounter for general adult medical examination without abnormal findings: Secondary | ICD-10-CM

## 2013-05-29 LAB — LIPID PANEL
CHOLESTEROL: 233 mg/dL — AB (ref 0–200)
HDL: 43 mg/dL (ref >39)
LDL CALC: 150 mg/dL — AB (ref 0–99)
TRIGLYCERIDES: 202 mg/dL — AB (ref <150)
Total CHOL/HDL Ratio: 5.4 Ratio
VLDL: 40 mg/dL (ref 0–40)

## 2013-05-29 LAB — COMPLETE METABOLIC PANEL WITH GFR
ALBUMIN: 4.1 g/dL (ref 3.5–5.2)
ALK PHOS: 77 U/L (ref 39–117)
ALT: 33 U/L (ref 0–35)
AST: 24 U/L (ref 0–37)
BUN: 9 mg/dL (ref 6–23)
CALCIUM: 9.3 mg/dL (ref 8.4–10.5)
CO2: 23 meq/L (ref 19–32)
Chloride: 105 mEq/L (ref 96–112)
Creat: 0.9 mg/dL (ref 0.50–1.10)
GFR, EST NON AFRICAN AMERICAN: 78 mL/min
GLUCOSE: 108 mg/dL — AB (ref 70–99)
POTASSIUM: 4.8 meq/L (ref 3.5–5.3)
SODIUM: 138 meq/L (ref 135–145)
TOTAL PROTEIN: 6.7 g/dL (ref 6.0–8.3)
Total Bilirubin: 0.4 mg/dL (ref 0.2–1.2)

## 2013-05-29 LAB — CBC WITH DIFFERENTIAL/PLATELET
BASOS ABS: 0 10*3/uL (ref 0.0–0.1)
BASOS PCT: 0 % (ref 0–1)
Eosinophils Absolute: 0.2 10*3/uL (ref 0.0–0.7)
Eosinophils Relative: 2 % (ref 0–5)
HEMATOCRIT: 41.5 % (ref 36.0–46.0)
Hemoglobin: 14.1 g/dL (ref 12.0–15.0)
LYMPHS PCT: 40 % (ref 12–46)
Lymphs Abs: 3.3 10*3/uL (ref 0.7–4.0)
MCH: 28.3 pg (ref 26.0–34.0)
MCHC: 34 g/dL (ref 30.0–36.0)
MCV: 83.3 fL (ref 78.0–100.0)
MONO ABS: 0.5 10*3/uL (ref 0.1–1.0)
Monocytes Relative: 6 % (ref 3–12)
NEUTROS ABS: 4.3 10*3/uL (ref 1.7–7.7)
NEUTROS PCT: 52 % (ref 43–77)
PLATELETS: 283 10*3/uL (ref 150–400)
RBC: 4.98 MIL/uL (ref 3.87–5.11)
RDW: 15.6 % — AB (ref 11.5–15.5)
WBC: 8.3 10*3/uL (ref 4.0–10.5)

## 2013-05-29 LAB — TSH: TSH: 1.532 u[IU]/mL (ref 0.350–4.500)

## 2013-05-29 LAB — POCT GLYCOSYLATED HEMOGLOBIN (HGB A1C): Hemoglobin A1C: 5.9

## 2013-05-30 ENCOUNTER — Telehealth: Payer: Self-pay | Admitting: Emergency Medicine

## 2013-05-30 ENCOUNTER — Other Ambulatory Visit: Payer: Self-pay | Admitting: Internal Medicine

## 2013-05-30 DIAGNOSIS — E785 Hyperlipidemia, unspecified: Secondary | ICD-10-CM

## 2013-05-30 DIAGNOSIS — E559 Vitamin D deficiency, unspecified: Secondary | ICD-10-CM

## 2013-05-30 LAB — VITAMIN D 25 HYDROXY (VIT D DEFICIENCY, FRACTURES): Vit D, 25-Hydroxy: 11 ng/mL — ABNORMAL LOW (ref 30–89)

## 2013-05-30 MED ORDER — SIMVASTATIN 20 MG PO TABS: 20.0000 mg | ORAL_TABLET | Freq: Every day | ORAL | Status: DC

## 2013-05-30 MED ORDER — ERGOCALCIFEROL 1.25 MG (50000 UT) PO CAPS: 50000.0000 [IU] | ORAL_CAPSULE | ORAL | Status: DC

## 2013-05-30 NOTE — Telephone Encounter (Signed)
Pt given lab results with instructions to start taking prescribed Simvastatin 20 mg and Vitamin D, to pick up at [facility] pharmacy. Pt educated on dietary/exercise modifications.

## 2013-05-30 NOTE — Telephone Encounter (Signed)
Left message for pt to call when message received 

## 2013-05-30 NOTE — Telephone Encounter (Signed)
Message copied by Darlis Loan on Wed May 30, 2013  3:13 PM ------      Message from: Holland Commons A      Created: Wed May 30, 2013  9:54 AM       Let patient know her cholesterol was high and vitamin D low. Sent prescription of simvastatin 20 mg and vitamin D to our pharmacy. Please educate patient on dietary and lifestyle modifications to help lower cholesterol ------

## 2013-05-30 NOTE — Telephone Encounter (Signed)
Message copied by Darlis Loan on Wed May 30, 2013 10:01 AM ------      Message from: Holland Commons A      Created: Wed May 30, 2013  9:54 AM       Let patient know her cholesterol was high and vitamin D low. Sent prescription of simvastatin 20 mg and vitamin D to our pharmacy. Please educate patient on dietary and lifestyle modifications to help lower cholesterol ------

## 2013-06-06 ENCOUNTER — Ambulatory Visit
Admission: RE | Admit: 2013-06-06 | Discharge: 2013-06-06 | Disposition: A | Discharge status: Home / Self Care | Payer: No Typology Code available for payment source | Source: Ambulatory Visit | Attending: Internal Medicine | Admitting: Internal Medicine

## 2013-06-06 DIAGNOSIS — Z1231 Encounter for screening mammogram for malignant neoplasm of breast: Secondary | ICD-10-CM

## 2013-06-08 ENCOUNTER — Other Ambulatory Visit: Payer: Self-pay | Admitting: Internal Medicine

## 2013-06-08 ENCOUNTER — Telehealth: Payer: Self-pay

## 2013-06-08 DIAGNOSIS — R928 Other abnormal and inconclusive findings on diagnostic imaging of breast: Secondary | ICD-10-CM

## 2013-06-08 NOTE — Telephone Encounter (Signed)
Patient notified her mammogram showed dense breast tissue which made it hard to see breast completely. Diagnostic mammogram ordered by Holland Commons, NP.  Patient called and notified to schedule appointment at [facility].  Patient verbalized understanding and indicates she will schedule appointment.

## 2013-06-19 ENCOUNTER — Ambulatory Visit
Admission: RE | Admit: 2013-06-19 | Discharge: 2013-06-19 | Disposition: A | Discharge status: Home / Self Care | Payer: No Typology Code available for payment source | Source: Ambulatory Visit | Attending: Internal Medicine | Admitting: Internal Medicine

## 2013-06-19 DIAGNOSIS — R928 Other abnormal and inconclusive findings on diagnostic imaging of breast: Secondary | ICD-10-CM

## 2013-06-28 ENCOUNTER — Ambulatory Visit (INDEPENDENT_AMBULATORY_CARE_PROVIDER_SITE_OTHER): Payer: No Typology Code available for payment source | Admitting: Obstetrics & Gynecology

## 2013-06-28 ENCOUNTER — Encounter: Payer: Self-pay | Admitting: Obstetrics & Gynecology

## 2013-06-28 VITALS — BP 131/97 | HR 87 | Temp 98.4°F | Ht 67.0 in | Wt 218.7 lb

## 2013-06-28 DIAGNOSIS — Z0000 Encounter for general adult medical examination without abnormal findings: Secondary | ICD-10-CM

## 2013-06-28 NOTE — Progress Notes (Signed)
   Subjective:    Patient ID: Audrey PlattMicah H Femia, female    DOB: 01/21/1970, 43 y.o.   MRN: [id number]  HPI 43 yo P2 is here because of a "throbbing, cramping" discomfort that has been occuring on a rare occasion (about once per month, lasting 40 minutes, resolves with Tylenol) since she was 43 yo. She says that no doctor has been able to tell her the etiology in the last 30 years. The sensation is not related to her periods. She denies dyspareunia.   Review of Systems Her last mammogram was 1 month ago. Her pap was several years ago.    Objective:   Physical Exam Normal vulva/vagina 1 st degree uterine prolapse (very minimal) NSSA, NT, no adnexal masses       Assessment & Plan:  Rare occasion of cramping Reassurance given

## 2013-06-28 NOTE — Progress Notes (Signed)
Here for c/o vaginal pain intermittently. C/o when she urinate feels pulling sensation.

## 2013-06-29 LAB — CYTOLOGY - PAP

## 2013-10-17 ENCOUNTER — Encounter: Payer: Self-pay | Admitting: Internal Medicine

## 2013-10-17 ENCOUNTER — Other Ambulatory Visit: Payer: Self-pay | Admitting: Internal Medicine

## 2013-10-17 ENCOUNTER — Ambulatory Visit: Payer: Self-pay | Attending: Internal Medicine | Admitting: Internal Medicine

## 2013-10-17 VITALS — BP 141/99 | HR 70 | Temp 98.1°F | Resp 16

## 2013-10-17 DIAGNOSIS — Z2821 Immunization not carried out because of patient refusal: Secondary | ICD-10-CM | POA: Insufficient documentation

## 2013-10-17 DIAGNOSIS — Z803 Family history of malignant neoplasm of breast: Secondary | ICD-10-CM | POA: Insufficient documentation

## 2013-10-17 DIAGNOSIS — N6320 Unspecified lump in the left breast, unspecified quadrant: Secondary | ICD-10-CM

## 2013-10-17 DIAGNOSIS — N63 Unspecified lump in breast: Secondary | ICD-10-CM | POA: Insufficient documentation

## 2013-10-17 DIAGNOSIS — I10 Essential (primary) hypertension: Secondary | ICD-10-CM | POA: Insufficient documentation

## 2013-10-17 DIAGNOSIS — E785 Hyperlipidemia, unspecified: Secondary | ICD-10-CM | POA: Insufficient documentation

## 2013-10-17 MED ORDER — LOSARTAN POTASSIUM 25 MG PO TABS: 25.0000 mg | ORAL_TABLET | Freq: Every day | ORAL | Status: DC

## 2013-10-17 NOTE — Patient Instructions (Signed)

## 2013-10-17 NOTE — Progress Notes (Signed)
Patient ID: Audrey Taylor, female   DOB: 03/11/1970, 43 y.o.   MRN: [id number]  CC: breast lump  HPI:  Patient reports that three weeks ago she found a hard lump in her left breast.  She states that on her last mammogram they found several cyst but this does not feel like the same one from the past ultrasound in June.  The lump was tender two weeks ago but not currently.  She states that at first the breast looked as if it had dimpled but now it is back to normal.    Allergies  Allergen Reactions  . Penicillins Other (See Comments)    Childhood allergy   Past Medical History  Diagnosis Date  . Hyperlipidemia   . Shoulder pain   . Menorrhagia   . Obesity    Current Outpatient Prescriptions on File Prior to Visit  Medication Sig Dispense Refill  . ergocalciferol (VITAMIN D2) 50000 UNITS capsule Take 1 capsule (50,000 Units total) by mouth once a week.  10 capsule  0  . meloxicam (MOBIC) 7.5 MG tablet Take 1 tablet (7.5 mg total) by mouth daily.  30 tablet  1  . simvastatin (ZOCOR) 20 MG tablet Take 1 tablet (20 mg total) by mouth at bedtime.  30 tablet  3   No current facility-administered medications on file prior to visit.   Family History  Problem Relation Age of Onset  . Breast cancer Mother    History   Social History  . Marital Status: Married    Spouse Name: N/A    Number of Children: N/A  . Years of Education: N/A   Occupational History  . Not on file.   Social History Main Topics  . Smoking status: Never Smoker   . Smokeless tobacco: Never Used  . Alcohol Use: No  . Drug Use: No  . Sexual Activity: Yes    Birth Control/ Protection: Surgical   Other Topics Concern  . Not on file   Social History Narrative  . No narrative on file   Review of Systems  Eyes: Negative.   Respiratory: Negative.   Cardiovascular: Negative.   Neurological: Negative for dizziness and headaches.  ]on.    Objective:   Filed Vitals:   10/17/13 1558  BP: 141/99  Pulse: 70    Temp: 98.1 F (36.7 C)  Resp: 16    Physical Exam  Constitutional: She is oriented to person, place, and time.  Cardiovascular: Normal rate, regular rhythm and normal heart sounds.   Pulmonary/Chest: Effort normal and breath sounds normal.    Abdominal: Soft. Bowel sounds are normal.  Neurological: She is alert and oriented to person, place, and time. She has normal reflexes.  '  Lab Results  Component Value Date   WBC 8.3 05/29/2013   HGB 14.1 05/29/2013   HCT 41.5 05/29/2013   MCV 83.3 05/29/2013   PLT 283 05/29/2013   Lab Results  Component Value Date   CREATININE 0.90 05/29/2013   BUN 9 05/29/2013   NA 138 05/29/2013   K 4.8 05/29/2013   CL 105 05/29/2013   CO2 23 05/29/2013    Lab Results  Component Value Date   HGBA1C 5.9 05/29/2013   Lipid Panel     Component Value Date/Time   CHOL 233* 05/29/2013 0957   TRIG 202* 05/29/2013 0957   HDL 43 05/29/2013 0957   CHOLHDL 5.4 05/29/2013 0957   VLDL 40 05/29/2013 0957   LDLCALC 150* 05/29/2013 0957  Assessment and plan:   Camelia EngMicah was seen today for follow-up.  Diagnoses and associated orders for this visit:  Essential hypertension - Begin losartan (COZAAR) 25 MG tablet; Take 1 tablet (25 mg total) by mouth daily.  Mass of breast, left - US Unlisted Procedure Breast; Future  Refused influenza vaccine   Return in about 2 weeks (around 10/31/2013) for Nurse Visit-BP check and 3 mo PCP.       Holland CommonsKECK, Jamera Vanloan, NP-C [facility] [phone] 10/21/2013, 7:52 PM

## 2013-10-17 NOTE — Progress Notes (Signed)
Pt is c/o a lump on her left breast.

## 2013-10-29 ENCOUNTER — Other Ambulatory Visit (HOSPITAL_COMMUNITY): Payer: Self-pay | Admitting: *Deleted

## 2013-10-29 DIAGNOSIS — N6320 Unspecified lump in the left breast, unspecified quadrant: Secondary | ICD-10-CM

## 2013-11-05 ENCOUNTER — Encounter: Payer: Self-pay | Admitting: Internal Medicine

## 2013-11-07 ENCOUNTER — Ambulatory Visit
Admission: RE | Admit: 2013-11-07 | Discharge: 2013-11-07 | Disposition: A | Discharge status: Home / Self Care | Payer: No Typology Code available for payment source | Source: Ambulatory Visit | Attending: Obstetrics and Gynecology | Admitting: Obstetrics and Gynecology

## 2013-11-07 ENCOUNTER — Other Ambulatory Visit (HOSPITAL_COMMUNITY): Payer: Self-pay | Admitting: *Deleted

## 2013-11-07 ENCOUNTER — Encounter (HOSPITAL_COMMUNITY): Payer: Self-pay

## 2013-11-07 ENCOUNTER — Other Ambulatory Visit: Payer: Self-pay | Admitting: Obstetrics and Gynecology

## 2013-11-07 ENCOUNTER — Ambulatory Visit (HOSPITAL_COMMUNITY)
Admission: RE | Admit: 2013-11-07 | Discharge: 2013-11-07 | Disposition: A | Discharge status: Home / Self Care | Payer: No Typology Code available for payment source | Source: Ambulatory Visit | Attending: Obstetrics and Gynecology | Admitting: Obstetrics and Gynecology

## 2013-11-07 VITALS — BP 140/96 | Ht 67.0 in | Wt 204.6 lb

## 2013-11-07 DIAGNOSIS — N6325 Unspecified lump in the left breast, overlapping quadrants: Secondary | ICD-10-CM

## 2013-11-07 DIAGNOSIS — N6320 Unspecified lump in the left breast, unspecified quadrant: Secondary | ICD-10-CM

## 2013-11-07 DIAGNOSIS — Z1239 Encounter for other screening for malignant neoplasm of breast: Secondary | ICD-10-CM

## 2013-11-07 DIAGNOSIS — N6310 Unspecified lump in the right breast, unspecified quadrant: Secondary | ICD-10-CM

## 2013-11-07 DIAGNOSIS — N6311 Unspecified lump in the right breast, upper outer quadrant: Secondary | ICD-10-CM

## 2013-11-07 NOTE — Patient Instructions (Signed)
Explained to Audrey KelchMicah Taylor that BCCCP will cover Pap smears and co-testing every 5 years unless has a history of abnormal Pap smears. [facility] and possible ultrasounds. Appointment scheduled for Wednesday, November 07, 2013 at 1115. Patient aware of appointment and will be there. Audrey KelchMicah Taylor verbalized understanding.

## 2013-11-07 NOTE — Progress Notes (Signed)
Complaints of a left breast lump x 5 weeks  Pap Smear:  Pap smear not completed today. Last Pap [facility] and normal with negative HPV. Per patient has a history of an abnormal Pap smear 7-10 years ago that required a repeat Pap smear for follow-up. Per patient all Pap smears have been normal since. Last Pap smear result is in EPIC.  Physical exam: Breasts Breasts symmetrical. No skin abnormalities bilateral breasts. No nipple retraction bilateral breasts. No nipple discharge bilateral breasts. No lymphadenopathy. Palpated a moveable lump within the left breast at 12 o'clock above the areola. Palpated a pea sized lump within the right breast at 11 o'clock 10 cm from the nipple. Complaints of tenderness when palpated both lumps. [facility] for bilateral diagnostic mammogram and possible ultrasounds. Appointment scheduled for Wednesday, November 07, 2013 at 1115.  Pelvic/Bimanual No Pap smear completed today since last Pap smear was 06/28/2013. Pap smear not indicated per BCCCP guidelines.

## 2014-02-28 ENCOUNTER — Ambulatory Visit: Payer: Self-pay

## 2014-07-10 ENCOUNTER — Encounter: Payer: Self-pay | Admitting: Internal Medicine

## 2014-07-10 ENCOUNTER — Ambulatory Visit: Payer: Self-pay | Attending: Internal Medicine | Admitting: Internal Medicine

## 2014-07-10 VITALS — BP 130/93 | HR 79 | Temp 98.4°F | Ht 65.5 in | Wt 202.0 lb

## 2014-07-10 DIAGNOSIS — Z79899 Other long term (current) drug therapy: Secondary | ICD-10-CM | POA: Insufficient documentation

## 2014-07-10 DIAGNOSIS — I10 Essential (primary) hypertension: Secondary | ICD-10-CM | POA: Insufficient documentation

## 2014-07-10 DIAGNOSIS — E785 Hyperlipidemia, unspecified: Secondary | ICD-10-CM | POA: Insufficient documentation

## 2014-07-10 MED ORDER — LOSARTAN POTASSIUM 25 MG PO TABS: 25.0000 mg | ORAL_TABLET | Freq: Every day | ORAL | Status: DC

## 2014-07-10 MED ORDER — SIMVASTATIN 20 MG PO TABS: 20.0000 mg | ORAL_TABLET | Freq: Every day | ORAL | Status: DC

## 2014-07-10 NOTE — Progress Notes (Signed)
Patient ID: Audrey Taylor, female   DOB: 09/10/1970, 44 y.o.   MRN: [id number] Subjective:  Audrey Taylor is a 44 y.o. female with hypertension.Has been out of BP medication for 2 months because she lost her orange card and thought that she could not come back.   Current Outpatient Prescriptions  Medication Sig Dispense Refill  . losartan (COZAAR) 25 MG tablet Take 1 tablet (25 mg total) by mouth daily. 30 tablet 3  . simvastatin (ZOCOR) 20 MG tablet Take 1 tablet (20 mg total) by mouth at bedtime. 30 tablet 3   No current facility-administered medications for this visit.    Hypertension ROS: taking medications as instructed, no medication side effects noted, no TIA's, no chest pain on exertion, no dyspnea on exertion and no swelling of ankles.  New concerns: Needs her mammogram repeated.  Objective:  BP 130/93 mmHg  Pulse 79  Temp(Src) 98.4 F (36.9 C)  Ht 5' 5.5" (1.664 m)  Wt 202 lb (91.627 kg)  BMI 33.09 kg/m2  SpO2 96%  Appearance alert, well appearing, and in no distress, oriented to person, place, and time and overweight. General exam BP noted to be borderline elevated today in office though normal at other visits, S1, S2 normal, no gallop, no murmur, chest clear, no JVD, no HSM, no edema.  Lab review: labs are reviewed, up to date and normal.   Assessment:   Hypertension needs further observation and needs to follow diet more regularly.  HLD Continue zocor, will recheck lipid panel in 6 months    Plan:  Current treatment plan is effective, no change in therapy. Reviewed diet, exercise and weight control. Recommended sodium restriction..  Return in about 2 weeks (around 07/24/2014) for Lab Visit and RN visit-BP check and 3 mo PCP.  Ambrose FinlandValerie A Skipper Dacosta, NP 07/21/2014 5:24 PM

## 2014-07-10 NOTE — Progress Notes (Signed)
Patient reports no issues and is here to follow up on her HTN She does need refills

## 2014-07-10 NOTE — Patient Instructions (Signed)
Do not eat after midnight the night before your lab work.

## 2014-07-17 ENCOUNTER — Other Ambulatory Visit: Payer: Self-pay | Admitting: Obstetrics and Gynecology

## 2014-07-17 DIAGNOSIS — Z1231 Encounter for screening mammogram for malignant neoplasm of breast: Secondary | ICD-10-CM

## 2014-07-24 ENCOUNTER — Ambulatory Visit (HOSPITAL_COMMUNITY)
Admission: RE | Admit: 2014-07-24 | Discharge: 2014-07-24 | Disposition: A | Discharge status: Home / Self Care | Payer: Self-pay | Source: Ambulatory Visit | Attending: Obstetrics and Gynecology | Admitting: Obstetrics and Gynecology

## 2014-07-24 DIAGNOSIS — Z1231 Encounter for screening mammogram for malignant neoplasm of breast: Secondary | ICD-10-CM

## 2014-08-05 ENCOUNTER — Ambulatory Visit: Payer: Self-pay

## 2014-09-06 ENCOUNTER — Ambulatory Visit: Payer: Self-pay | Attending: Internal Medicine | Admitting: *Deleted

## 2014-09-06 VITALS — BP 144/90 | HR 75 | Temp 98.1°F | Resp 14 | Ht 67.0 in | Wt 201.2 lb

## 2014-09-06 DIAGNOSIS — I10 Essential (primary) hypertension: Secondary | ICD-10-CM

## 2014-09-06 NOTE — Patient Instructions (Signed)
DASH Eating Plan °DASH stands for "Dietary Approaches to Stop Hypertension." The DASH eating plan is a healthy eating plan that has been shown to reduce high blood pressure (hypertension). Additional health benefits may include reducing the risk of type 2 diabetes mellitus, heart disease, and stroke. The DASH eating plan may also help with weight loss. °WHAT DO I NEED TO KNOW ABOUT THE DASH EATING PLAN? °For the DASH eating plan, you will follow these general guidelines: °· Choose foods with a percent daily value for sodium of less than 5% (as listed on the food label). °· Use salt-free seasonings or herbs instead of table salt or sea salt. °· Check with your health care provider or pharmacist before using salt substitutes. °· Eat lower-sodium products, often labeled as "lower sodium" or "no salt added." °· Eat fresh foods. °· Eat more vegetables, fruits, and low-fat dairy products. °· Choose whole grains. Look for the word "whole" as the first word in the ingredient list. °· Choose fish and skinless chicken or turkey more often than red meat. Limit fish, poultry, and meat to 6 oz (170 g) each day. °· Limit sweets, desserts, sugars, and sugary drinks. °· Choose heart-healthy fats. °· Limit cheese to 1 oz (28 g) per day. °· Eat more home-cooked food and less restaurant, buffet, and fast food. °· Limit fried foods. °· Cook foods using methods other than frying. °· Limit canned vegetables. If you do use them, rinse them well to decrease the sodium. °· When eating at a restaurant, ask that your food be prepared with less salt, or no salt if possible. °WHAT FOODS CAN I EAT? °Seek help from a dietitian for individual calorie needs. °Grains °Whole grain or whole wheat bread. Brown rice. Whole grain or whole wheat pasta. Quinoa, bulgur, and whole grain cereals. Low-sodium cereals. Corn or whole wheat flour tortillas. Whole grain cornbread. Whole grain crackers. Low-sodium crackers. °Vegetables °Fresh or frozen vegetables  (raw, steamed, roasted, or grilled). Low-sodium or reduced-sodium tomato and vegetable juices. Low-sodium or reduced-sodium tomato sauce and paste. Low-sodium or reduced-sodium canned vegetables.  °Fruits °All fresh, canned (in natural juice), or frozen fruits. °Meat and Other Protein Products °Ground beef (85% or leaner), grass-fed beef, or beef trimmed of fat. Skinless chicken or turkey. Ground chicken or turkey. Pork trimmed of fat. All fish and seafood. Eggs. Dried beans, peas, or lentils. Unsalted nuts and seeds. Unsalted canned beans. °Dairy °Low-fat dairy products, such as skim or 1% milk, 2% or reduced-fat cheeses, low-fat ricotta or cottage cheese, or plain low-fat yogurt. Low-sodium or reduced-sodium cheeses. °Fats and Oils °Tub margarines without trans fats. Light or reduced-fat mayonnaise and salad dressings (reduced sodium). Avocado. Safflower, olive, or canola oils. Natural peanut or almond butter. °Other °Unsalted popcorn and pretzels. °The items listed above may not be a complete list of recommended foods or beverages. Contact your dietitian for more options. °WHAT FOODS ARE NOT RECOMMENDED? °Grains °White bread. White pasta. White rice. Refined cornbread. Bagels and croissants. Crackers that contain trans fat. °Vegetables °Creamed or fried vegetables. Vegetables in a cheese sauce. Regular canned vegetables. Regular canned tomato sauce and paste. Regular tomato and vegetable juices. °Fruits °Dried fruits. Canned fruit in light or heavy syrup. Fruit juice. °Meat and Other Protein Products °Fatty cuts of meat. Ribs, chicken wings, bacon, sausage, bologna, salami, chitterlings, fatback, hot dogs, bratwurst, and packaged luncheon meats. Salted nuts and seeds. Canned beans with salt. °Dairy °Whole or 2% milk, cream, half-and-half, and cream cheese. Whole-fat or sweetened yogurt. Full-fat   cheeses or blue cheese. Nondairy creamers and whipped toppings. Processed cheese, cheese spreads, or cheese  curds. °Condiments °Onion and garlic salt, seasoned salt, table salt, and sea salt. Canned and packaged gravies. Worcestershire sauce. Tartar sauce. Barbecue sauce. Teriyaki sauce. Soy sauce, including reduced sodium. Steak sauce. Fish sauce. Oyster sauce. Cocktail sauce. Horseradish. Ketchup and mustard. Meat flavorings and tenderizers. Bouillon cubes. Hot sauce. Tabasco sauce. Marinades. Taco seasonings. Relishes. °Fats and Oils °Butter, stick margarine, lard, shortening, ghee, and bacon fat. Coconut, palm kernel, or palm oils. Regular salad dressings. °Other °Pickles and olives. Salted popcorn and pretzels. °The items listed above may not be a complete list of foods and beverages to avoid. Contact your dietitian for more information. °WHERE CAN I FIND MORE INFORMATION? °National Heart, [employer], [employer] Institute: www.nhlbi.nih.gov/health/health-topics/topics/dash/ °Document Released: 12/10/2010 Document Revised: 05/07/2013 Document Reviewed: 10/25/2012 °ExitCare® Patient Information ©2015 [employer]. This information is not intended to replace advice given to you by your health care provider. Make sure you discuss any questions you have with your health care provider. ° °

## 2014-09-06 NOTE — Progress Notes (Signed)
Patient presents for BP check after restarting losartan, however took last dose yesterday AM; states forgot today due to rushing Med list reviewed; states taking both meds as directed Discussed need for low sodium diet and using Mrs. Dash as alternative to salt Encouraged to choose foods with 5% or less of daily value for sodium. Discussed walking 30-60 minutes minutes per day for exercise Patient denies SHOB, chest pain  States "slight" headache 5 days ago, relieved with aspirin. Positive for blurred vision; states she needs new eye glasses  Filed Vitals:   09/06/14 1417  BP: 144/90  Pulse: 75  Temp: 98.1 F (36.7 C)  Resp: 14    Patient to return in 2 weeks for nurse visit for BP check after taking losartan that day  Patient advised to call for med refills at least 7 days before running out so as not to go without.  Patient given literature on DASH Eating Plan

## 2014-09-24 ENCOUNTER — Encounter: Payer: Self-pay | Admitting: Pharmacist

## 2014-09-26 ENCOUNTER — Encounter: Payer: Self-pay | Admitting: Pharmacist

## 2014-09-26 ENCOUNTER — Ambulatory Visit: Payer: Self-pay | Attending: Internal Medicine | Admitting: Pharmacist

## 2014-09-26 VITALS — BP 115/77 | HR 80

## 2014-09-26 DIAGNOSIS — Z79899 Other long term (current) drug therapy: Secondary | ICD-10-CM | POA: Insufficient documentation

## 2014-09-26 DIAGNOSIS — I10 Essential (primary) hypertension: Secondary | ICD-10-CM | POA: Insufficient documentation

## 2014-09-26 MED ORDER — LOSARTAN POTASSIUM 25 MG PO TABS: 25.0000 mg | ORAL_TABLET | Freq: Every day | ORAL | Status: DC

## 2014-09-26 NOTE — Patient Instructions (Signed)
Good to see you today!  Your blood pressure looks great today! Continue taking your medicines as prescribed  Follow up with Holland Commons as needed.

## 2014-09-26 NOTE — Progress Notes (Signed)
S:    Patient arrives in good spirits. Presents to the clinic for hypertension evaluation.   Patient reports adherence with medications.  Current BP Medications include:  Losartan 25 mg daily.    O:   Last 3 Office BP readings: BP Readings from Last 3 Encounters:  09/26/14 115/77  09/06/14 144/90  07/10/14 130/93    BMET    Component Value Date/Time   NA 138 05/29/2013 0957   K 4.8 05/29/2013 0957   CL 105 05/29/2013 0957   CO2 23 05/29/2013 0957   GLUCOSE 108* 05/29/2013 0957   BUN 9 05/29/2013 0957   CREATININE 0.90 05/29/2013 0957   CREATININE 1.12 12/31/2008 2013   CALCIUM 9.3 05/29/2013 0957   GFRNONAA 78 05/29/2013 0957   GFRAA >89 05/29/2013 0957    A/P: History of hypertension currently controlled on current medications. No recommendations for any changes to medications. Will refill losartan for patient. Congratulated patient on meeting blood pressure goals.   Results reviewed and written information provided.   Total time in face-to-face counseling 10 minutes.   F/U Clinic Visit with Holland Commons, NP.  Patient seen with Sherle Poe, PharmD Resident.

## 2014-12-02 ENCOUNTER — Other Ambulatory Visit: Payer: Self-pay | Admitting: Internal Medicine

## 2015-03-19 ENCOUNTER — Encounter: Payer: Self-pay | Admitting: Internal Medicine

## 2015-03-19 ENCOUNTER — Ambulatory Visit: Payer: Medicaid Other | Attending: Internal Medicine | Admitting: Internal Medicine

## 2015-03-19 VITALS — BP 149/87 | HR 79 | Temp 98.0°F | Resp 16 | Ht 67.0 in | Wt 213.0 lb

## 2015-03-19 DIAGNOSIS — E669 Obesity, unspecified: Secondary | ICD-10-CM | POA: Insufficient documentation

## 2015-03-19 DIAGNOSIS — I10 Essential (primary) hypertension: Secondary | ICD-10-CM | POA: Diagnosis not present

## 2015-03-19 DIAGNOSIS — Z880 Allergy status to penicillin: Secondary | ICD-10-CM | POA: Insufficient documentation

## 2015-03-19 DIAGNOSIS — M25512 Pain in left shoulder: Secondary | ICD-10-CM | POA: Insufficient documentation

## 2015-03-19 DIAGNOSIS — Z79899 Other long term (current) drug therapy: Secondary | ICD-10-CM | POA: Diagnosis not present

## 2015-03-19 DIAGNOSIS — E785 Hyperlipidemia, unspecified: Secondary | ICD-10-CM | POA: Diagnosis not present

## 2015-03-19 DIAGNOSIS — E781 Pure hyperglyceridemia: Secondary | ICD-10-CM | POA: Diagnosis not present

## 2015-03-19 DIAGNOSIS — M25511 Pain in right shoulder: Secondary | ICD-10-CM | POA: Diagnosis not present

## 2015-03-19 MED ORDER — AMLODIPINE BESYLATE 5 MG PO TABS: 5.0000 mg | ORAL_TABLET | Freq: Every day | ORAL | Status: DC

## 2015-03-19 MED ORDER — SIMVASTATIN 20 MG PO TABS: 20.0000 mg | ORAL_TABLET | Freq: Every day | ORAL | Status: DC

## 2015-03-19 NOTE — Progress Notes (Signed)
Patient here for follow up on her HTN Patient states she stopped taking her losartan about three to four months Ago because it was making her dizzy and lightheaded

## 2015-03-19 NOTE — Progress Notes (Signed)
Patient ID: Audrey Taylor, female   DOB: 1970-02-08, 45 y.o.   MRN: [id number]  CC: follow up HTN  HPI: Audrey Taylor is a 45 y.o. female here today for a follow up visit.  Patient has past medical history of hypertension and hypertriglyceridemia.  Patient BP is elevated this visit.  Patient states she stopped taking the amlodipine about 3-4 months ago.  Patient states the medication made her feel dizzy and lightheaded.  Patient denies any lightheadedness, dizziness, blurred vision, nausea, or vomiting currently.  Patient reports bilateral achy shoulder pain at night.  Patient states the pain occurs when she lays on her shoulders and her back.  Patient she cannot hold her arms up for an extended amount of time or carry anything heavier than a gallon of milk. Patient denies any injury.  Patient states that she has to take Tylenol nightly to sleep, otherwise the pain will wake her up.  Patient states there is no pain when she sitting up.    Patient has No headache, No chest pain, No abdominal pain - No Nausea, No new weakness tingling or numbness, No Cough - SOB.  Allergies  Allergen Reactions  . Penicillins Other (See Comments)    Childhood allergy   Past Medical History  Diagnosis Date  . Hyperlipidemia   . Shoulder pain   . Menorrhagia   . Obesity    No current outpatient prescriptions on file prior to visit.   No current facility-administered medications on file prior to visit.   Family History  Problem Relation Age of Onset  . Breast cancer Mother   . Hypertension Father    Social History   Social History  . Marital Status: Married    Spouse Name: N/A  . Number of Children: N/A  . Years of Education: N/A   Occupational History  . Not on file.   Social History Main Topics  . Smoking status: Never Smoker   . Smokeless tobacco: Never Used  . Alcohol Use: No  . Drug Use: No  . Sexual Activity: Yes    Birth Control/ Protection: Surgical   Other Topics Concern  . Not on file    Social History Narrative    Review of Systems: Constitutional: Negative for fever, chills, diaphoresis, activity change, appetite change and fatigue. HENT: Negative for ear pain, nosebleeds, congestion, facial swelling, rhinorrhea, neck pain, neck stiffness and ear discharge.  Eyes: Negative for pain, discharge, redness, itching and visual disturbance. Respiratory: Negative for cough, choking, chest tightness, shortness of breath, wheezing and stridor.  Cardiovascular: Negative for chest pain, palpitations and leg swelling. Musculoskeletal: bilateral shoulder pain Negative for back pain, joint swelling, arthralgias and gait problem. Neurological: Negative for dizziness, tremors, seizures, syncope, facial asymmetry, speech difficulty, weakness, light-headedness, numbness and headaches.   Objective:   Filed Vitals:   03/19/15 1409  BP: 149/87  Pulse: 79  Temp: 98 F (36.7 C)  Resp: 16    Physical Exam: Constitutional: Patient appears well-developed and well-nourished. No distress. HENT: Normocephalic, atraumatic, External right and left ear normal.   Eyes: Conjunctivae and EOM are normal.  Neck: Normal ROM. Neck supple. No JVD.   CVS: RRR, S1/S2 +, no murmurs, no gallops, no carotid bruit.  Pulmonary: Effort and breath sounds normal, no stridor, rhonchi, wheezes, rales.  Musculoskeletal: Normal range of motion. No edema and no tenderness.  Neuro: Alert.  Skin: Skin is warm and dry. No rash noted. Not diaphoretic. No erythema. No pallor. Psychiatric: Normal mood and affect.  Behavior, judgment, thought content normal.  Lab Results  Component Value Date   WBC 8.3 05/29/2013   HGB 14.1 05/29/2013   HCT 41.5 05/29/2013   MCV 83.3 05/29/2013   PLT 283 05/29/2013   Lab Results  Component Value Date   CREATININE 0.90 05/29/2013   BUN 9 05/29/2013   NA 138 05/29/2013   K 4.8 05/29/2013   CL 105 05/29/2013   CO2 23 05/29/2013    Lab Results  Component Value Date    HGBA1C 5.9 05/29/2013   Lipid Panel     Component Value Date/Time   CHOL 233* 05/29/2013 0957   TRIG 202* 05/29/2013 0957   HDL 43 05/29/2013 0957   CHOLHDL 5.4 05/29/2013 0957   VLDL 40 05/29/2013 0957   LDLCALC 150* 05/29/2013 0957       Assessment and plan:   Fiorella was seen today for follow-up.  Diagnoses and all orders for this visit:  Essential hypertension -     amLODipine (NORVASC) 5 MG tablet; Take 1 tablet (5 mg total) by mouth daily. Blood pressure Patient educated on the importance of adherence to medications and encouraged to call if having adverse effects.  Patient to return for BP check next week. Patient counseled for weight loss and diet modifications.  HLD (hyperlipidemia) -     simvastatin (ZOCOR) 20 MG tablet; Take 1 tablet (20 mg total) by mouth at bedtime. For cholesterol Patient to return next week for lipid panel and BMP.  Patient counseled regarding lifestyle modifications.   Bilateral shoulder pain -     Ambulatory referral to Orthopedic Surgery Likely arthritis.  Return in about 2 weeks (around 04/02/2015) for Stacy-BP check and 3 mo PCP HTN.       Marlane HatcherErica Christinea Brizuela, BSN, RN, NP student [facility] [phone] 03/19/2015, 3:01 PM

## 2015-03-21 ENCOUNTER — Encounter: Payer: Self-pay | Admitting: Clinical

## 2015-03-21 NOTE — Progress Notes (Signed)
[facility] 03/19/2015 07/10/2014 05/21/2013  Decreased Interest 0 0 0  Down, Depressed, Hopeless 0 0 0  PHQ - 2 Score 0 0 0  PHQ9: 1 (feeling tired or having little energy)  GAD 7 : Generalized Anxiety Score 03/19/2015  Nervous, Anxious, on Edge 0  Control/stop worrying 0  Worry too much - different things 0  Trouble relaxing 0  Restless 0  Easily annoyed or irritable 0  Afraid - awful might happen 0  Total GAD 7 Score 0

## 2015-04-01 ENCOUNTER — Ambulatory Visit (INDEPENDENT_AMBULATORY_CARE_PROVIDER_SITE_OTHER): Payer: Medicaid Other | Admitting: Family Medicine

## 2015-04-01 ENCOUNTER — Encounter: Payer: Self-pay | Admitting: Family Medicine

## 2015-04-01 VITALS — BP 152/98 | HR 87 | Ht 67.0 in | Wt 216.0 lb

## 2015-04-01 DIAGNOSIS — M238X1 Other internal derangements of right knee: Secondary | ICD-10-CM | POA: Diagnosis not present

## 2015-04-01 DIAGNOSIS — M7542 Impingement syndrome of left shoulder: Secondary | ICD-10-CM

## 2015-04-01 DIAGNOSIS — M25511 Pain in right shoulder: Secondary | ICD-10-CM | POA: Diagnosis not present

## 2015-04-01 DIAGNOSIS — M25512 Pain in left shoulder: Secondary | ICD-10-CM | POA: Diagnosis not present

## 2015-04-01 DIAGNOSIS — M25861 Other specified joint disorders, right knee: Secondary | ICD-10-CM

## 2015-04-01 NOTE — Progress Notes (Signed)
  Audrey PlattMicah H Taylor - 45 y.o. female MRN [id number]  Date of birth: 10/21/1970  CC: Bilateral shoulder pain  SUBJECTIVE:   HPI  Audrey Taylor is a very pleasant 45 year old female with over 3 years of bilateral shoulder pain. She denies any trauma. She states she only has pain at night when laying on either side or when lifting her arms above her head. She states she did have a time when she could barely lift her arm although she cannot remember which one. Currently she has no limitation to her motion although she has pain when lifting her arms above 90 or when lifting something heavy above 90. She has not had any injections. She does take Tylenol PM which helps her sleep. Day-to-day she has more pain in one or the other, but this is not consistent. She does not feel like she has any weakness. She does not have any loss of dexterity in her hands. She has no pain radiating to her hands. She is not working. In the past she worked as a Water engineerhome health aide. She does take care of her [id number]-month-old grandson who she frequently lifts, although this is not painful unless she goes above 90.  ROS:     14 point review of systems negative other than that listed above in history of present illness regards to musculoskeletal issue.  HISTORY: Past Medical, Surgical, Social, and Family History Reviewed & Updated per EMR.  Pertinent Historical Findings include: Obesity, hypertension, hyperlipidemia  OBJECTIVE: BP 152/98 mmHg  Pulse 87  Ht 5\' 7"  (1.702 m)  Wt 216 lb (97.977 kg)  BMI 33.82 kg/m2  Physical Exam  Calm, no acute distress Nonlabored breathing  Shoulder: b/l Inspection reveals no abnormalities, atrophy or asymmetry. Palpation is normal with no tenderness over AC joint. She does have vague discomfort over the anterior shoulder but this is not reproducible. ROM is full in all planes although she has pain with terminal range of motion.. Rotator cuff strength normal throughout. Positive signs of impingement  with Neer, Hawkins, and empty can. Speeds and Yergason's tests normal. Normal scapular function observed. No painful arc and no drop arm sign. No apprehension sign  MEDICATIONS, LABS & OTHER ORDERS: Previous Medications   AMLODIPINE (NORVASC) 5 MG TABLET    Take 1 tablet (5 mg total) by mouth daily. Blood pressure   SIMVASTATIN (ZOCOR) 20 MG TABLET    Take 1 tablet (20 mg total) by mouth at bedtime. For cholesterol   Modified Medications   No medications on file   New Prescriptions   No medications on file   Discontinued Medications   No medications on file  No orders of the defined types were placed in this encounter.   ASSESSMENT & PLAN: Audrey Taylor's history and exam are very consistent with bilateral rotator cuff impingement: We discussed the option of an injection which she declined. She will do rotator cuff rehabilitation exercises daily for the next month and follow-up at that time. Depending on her response may ultrasound her and consider a subacromial injection at that time. Her strength is very good and I doubt she has a significant rotator cuff tear. She can continue to take Tylenol PM as needed. She should call with any questions in the interim.

## 2015-04-03 ENCOUNTER — Encounter: Payer: Self-pay | Admitting: Pharmacist

## 2015-04-03 ENCOUNTER — Ambulatory Visit: Payer: Medicaid Other | Attending: Internal Medicine | Admitting: Pharmacist

## 2015-04-03 ENCOUNTER — Ambulatory Visit: Payer: Medicaid Other

## 2015-04-03 VITALS — BP 132/92 | HR 78

## 2015-04-03 DIAGNOSIS — E785 Hyperlipidemia, unspecified: Secondary | ICD-10-CM

## 2015-04-03 DIAGNOSIS — I10 Essential (primary) hypertension: Secondary | ICD-10-CM

## 2015-04-03 DIAGNOSIS — Z79899 Other long term (current) drug therapy: Secondary | ICD-10-CM | POA: Diagnosis not present

## 2015-04-03 LAB — BASIC METABOLIC PANEL
BUN: 9 mg/dL (ref 7–25)
CALCIUM: 9.6 mg/dL (ref 8.6–10.2)
CO2: 24 mmol/L (ref 20–31)
CREATININE: 0.84 mg/dL (ref 0.50–1.10)
Chloride: 104 mmol/L (ref 98–110)
Glucose, Bld: 100 mg/dL — ABNORMAL HIGH (ref 65–99)
Potassium: 4.4 mmol/L (ref 3.5–5.3)
SODIUM: 139 mmol/L (ref 135–146)

## 2015-04-03 LAB — LIPID PANEL
Cholesterol: 229 mg/dL — ABNORMAL HIGH (ref 125–200)
HDL: 43 mg/dL — AB (ref >=46)
LDL CALC: 140 mg/dL — AB (ref <130)
TRIGLYCERIDES: 229 mg/dL — AB (ref <150)
Total CHOL/HDL Ratio: 5.3 Ratio — ABNORMAL HIGH (ref <=5.0)
VLDL: 46 mg/dL — AB (ref <30)

## 2015-04-03 NOTE — Progress Notes (Signed)
S:    Patient arrives in good spirits.  Presents to the clinic for hypertension evaluation. Patient was referred on 03/19/15 by Holland CommonsValerie Keck.  Patient was last seen by Primary Care Provider on 03/19/15.   Patient denies adherence with medications. She often forgets to take her medication and has missed it the last two days. She thinks that setting an alarm on her phone may help.  Current BP Medications include:  Amlodipine 5 mg daily.   Antihypertensives tried in the past include: losartan    O:   Last 3 Office BP readings: BP Readings from Last 3 Encounters:  04/01/15 152/98  03/19/15 149/87  09/26/14 115/77    BMET    Component Value Date/Time   NA 138 05/29/2013 0957   K 4.8 05/29/2013 0957   CL 105 05/29/2013 0957   CO2 23 05/29/2013 0957   GLUCOSE 108* 05/29/2013 0957   BUN 9 05/29/2013 0957   CREATININE 0.90 05/29/2013 0957   CREATININE 1.12 12/31/2008 2013   CALCIUM 9.3 05/29/2013 0957   GFRNONAA 78 05/29/2013 0957   GFRAA >89 05/29/2013 0957    A/P: Hypertension longstanding currently controlled on current medications.  Continued amlodipine 5 mg daily. Encourage patient to set an alarm on her phone to help her remember to take her medication every day or to place it in a place that will help her remember to take it every day. Patient verbalized understanding.  Results reviewed and written information provided. Total time in face-to-face counseling 10 minutes.   F/U Clinic Visit with Holland CommonsValerie Keck or new PCP

## 2015-04-03 NOTE — Patient Instructions (Addendum)
Thanks for coming to see me!  Continue amlodipine 5 mg daily for blood pressure  I think it is a good idea to set an alarm on your phone to help you remember to take it  Follow up with Audrey Taylor or new doctor  Basics of Medicine Management WHAT SHOULD I DO WHEN I AM TAKING MEDICINES?   Read all of the labels and the inserts that come with your medicines. Review the information often.  Talk with your pharmacist if you notice a change in the size, color, or shape of your medicines.  Try to get all of your medicines at one pharmacy. The pharmacist will have all your information and will understand possible drug interactions.  Ask your health care provider any questions that you have about your prescribed medicines and any over-the-counter medicines, vitamins, and herbal or dietary supplements that you take. It is important to make sure that nothing will interact with any of your prescribed medicines. WHAT SHOULD I KNOW ABOUT MY MEDICINES?  Know the potential side effects for each medicine that you take.  Know what each of your medicines looks like. This includes size, color, and shape.  If you are getting confused and having trouble recognizing your different medicines, ask your health care provider or pharmacist about changing your medicines or helping you to identify them more easily. HOW CAN I TAKE MY MEDICINES SAFELY?  Take medicines only as directed by your health care provider.  Do not take more of your medicine than instructed.  Do not take anyone else's medicines.  Do not share your medicines with other people.  Do not stop taking your medicines unless you have talked about that with your health care provider.  You may need to avoid alcohol or certain foods or liquids with one or more of your medicines. Follow your health care provider's instructions.  Do not split, mash, or chew your medicines unless your health care provider tells you to do so. Tell your health care  provider if you have trouble swallowing your medicines.  For every liquid medicine, use the dosing container that was provided. HOW SHOULD I ORGANIZE MY MEDICINES?  Use a tool, such as a weekly pillbox, a written chart from your health care provider, a notebook, or your own calendar to organize your medicine schedule.  If you have trouble recognizing your different medicines, keep them in their original bottles.  Create reminders for taking your medicines. Use sticky notes, or use alarms on your watch, mobile device, or phone calendar.  Your organization system should help you to remember the following information about each medicine:  Name of the medicine.  Dosage.  Schedule. This includes the day and time when it should be taken.  Appearance. This includes color, shape, size, and stamp.  How to take your medicines. You may need to take them with or without certain foods, on an empty stomach, with fluids, or by following some other instruction.  More advanced medicine management systems are also available. These offer weekly or monthly options that are complete with storage, alarms, and visual and audio prompts.  Review your medicine schedule with a family member, friend, or caregiver. Other household members should understand your medicines.  If you have trouble reading the names of your different medicines, ask your pharmacist to provide your medicines in containers with large print.  If you take any medicines on an "as needed" basis, such as medicines for nausea or pain, it is important that you remember what  you have taken and when you did so. Write down the following information each time you take an "as needed" medicine: the name, the dosage, and the date and time that you took it. HOW SHOULD I PLAN AHEAD FOR TRAVEL?  Take your pillbox, medicines, and organization system with you when you travel.   Have your medicines refilled before you leave for travel. This will ensure  that you do not run out of your medicines while you are away from home.  Always carry an updated list of your medicines with you. If there is an emergency, a respondent can quickly see what medicines you are taking. HOW SHOULD I STORE AND DISCARD MY MEDICINES?  Store medicines in a cool, dry area away from light or as directed by your pharmacist or health care provider. The bathroom is not a good place for medicine storage because of heat and humidity.  Store your medicines away from chemicals, medicines for your pet, and medicines of other household members.  Keep medicines where children cannot reach them. Do not leave them on counters or bedside tables. Store them in high cabinets or on high shelves.  Check expiration dates regularly. Do not take expired medicines. Discard medicines that are older than the expiration date.  Learn about the best way to dispose of each medicine that you take. Find out if your local government recycling program, hospital, or pharmacy has a medicine take-back program for safe disposal. If not, some medicines may be mixed with inedible substances and thrown away in the trash in a sealed bag or empty container. WHAT SHOULD I REMEMBER?  Tell your health care provider if you experience side effects, you have new symptoms, or you have other concerns. There may be dosing changes or alternative medicines that would be better for you.  Review your medicines regularly with your health care provider. Ask if you need to continue to take each medicine, and discuss how well each one is working. Medicines, diet, medical conditions, weight changes, and other habits can all affect how medicines work.  Refill your medicines early so that you do not risk running out.  In case of an accidental overdose, call your local Poison Control Center at 857 644 7110 or visit your local emergency department immediately. This is important. WHAT SHOULD I KNOW ABOUT GIVING MEDICINES TO MY  CHILD?  Use positive reinforcement to help your child take necessary medicines. Try singing, cuddling, and rewards.  Use only the syringes, droppers, dosing spoons, or dosing cups from your child's health care provider or pharmacist.  Always wash your hands before giving medicines.  Learn about the medicine policies at your child's school.  Meet with the school nurse to review your child's medicine schedule in detail.  Do not send oral medicines to school with your child.  If your child has trouble taking medicine, forgets a dose, or spits it up, talk with his or her health care provider.  Do not give over-the-counter cough and cold medicines to your child who is younger than 84 years old, unless directed by his or her health care provider.  Do not give your child aspirin unless instructed to do so by your child's pediatrician or cardiologist.  Make sure that your child knows how to use an inhaler properly, if needed.   This information is not intended to replace advice given to you by your health care provider. Make sure you discuss any questions you have with your health care provider.   Document Released: 04/07/2010  Document Revised: 01/11/2014 Document Reviewed: 08/23/2013 Elsevier Interactive Patient Education [employer]

## 2015-04-08 ENCOUNTER — Other Ambulatory Visit: Payer: Self-pay | Admitting: Internal Medicine

## 2015-04-08 DIAGNOSIS — E785 Hyperlipidemia, unspecified: Secondary | ICD-10-CM

## 2015-04-08 MED ORDER — ATORVASTATIN CALCIUM 20 MG PO TABS: 20.0000 mg | ORAL_TABLET | Freq: Every day | ORAL | Status: DC

## 2015-04-29 ENCOUNTER — Ambulatory Visit (INDEPENDENT_AMBULATORY_CARE_PROVIDER_SITE_OTHER): Payer: Medicaid Other | Admitting: Family Medicine

## 2015-04-29 ENCOUNTER — Encounter: Payer: Self-pay | Admitting: Family Medicine

## 2015-04-29 VITALS — BP 154/91 | HR 77 | Ht 67.0 in | Wt 216.0 lb

## 2015-04-29 DIAGNOSIS — M7531 Calcific tendinitis of right shoulder: Secondary | ICD-10-CM | POA: Diagnosis not present

## 2015-04-29 DIAGNOSIS — M7542 Impingement syndrome of left shoulder: Secondary | ICD-10-CM | POA: Diagnosis not present

## 2015-04-29 MED ORDER — METHYLPREDNISOLONE ACETATE 40 MG/ML IJ SUSP
40.0000 mg | Freq: Once | INTRAMUSCULAR | Status: AC
  Administered 2015-04-29: 40 mg via INTRA_ARTICULAR

## 2015-04-29 MED ORDER — MELOXICAM 7.5 MG PO TABS: 7.5000 mg | ORAL_TABLET | Freq: Every day | ORAL | Status: DC

## 2015-04-29 NOTE — Progress Notes (Signed)
Audrey Taylor - 45 y.o. female MRN [id number]  Date of birth: 05/31/1970  CC: Bilateral shoulder pain  SUBJECTIVE:   HPI  04/01/2015: Audrey Taylor is a very pleasant 45 year old female with over 3 years of bilateral shoulder pain. She denies any trauma. She states she only has pain at night when laying on either side or when lifting her arms above her head. She states she did have a time when she could barely lift her arm although she cannot remember which one. Currently she has no limitation to her motion although she has pain when lifting her arms above 90 or when lifting something heavy above 90. She has not had any injections. She does take Tylenol PM which helps her sleep. Day-to-day she has more pain in one or the other, but this is not consistent. She does not feel like she has any weakness. She does not have any loss of dexterity in her hands. She has no pain radiating to her hands. She is not working. In the past she worked as a Water engineer. She does take care of her 52-month-old grandson who she frequently lifts, although this is not painful unless she goes above 90.  Today's appointment: Audrey Taylor returns today with her husband for follow-up of bilateral chronic shoulder pain. We provided her with rotator cuff exercises. She was only able to do them for roughly 1 week before she had too much pain. She continues to have any pain lifting her arms above her head. She is also been taking Tylenol PM which helps minimally. She continues to have significant pain when laying on either shoulder at night. As listed above she previously worked as a Water engineer and takes care of her 15-month-old grandson who she is frequently lifting. She's had no new injuries.  ROS:     14 point review of systems negative other than that listed above in history of present illness regards to musculoskeletal issue.  HISTORY: Past Medical, Surgical, Social, and Family History Reviewed & Updated per EMR.   Pertinent Historical Findings include: Obesity, hypertension, hyperlipidemia  OBJECTIVE: BP 154/91 mmHg  Pulse 77  Ht  (1.702 m)  Wt 216 lb (97.977 kg)  BMI 33.82 kg/m2  Physical Exam  Calm, no acute distress Nonlabored breathing  Shoulder: b/l Inspection reveals no abnormalities, atrophy or asymmetry. Palpation is normal with no tenderness over AC joint. She does have vague discomfort over the anterior shoulder but this is not reproducible. Range of motion is limited bilaterally to 90 abduction. Full range of motion with forward flexion. Rotator cuff strength limited with internal rotation on the left and bilateral supraspinatus weakness.   Positive signs of impingement with Neer, Hawkins, and empty can bilaterally. Positive speeds and Yergason's on the right Painful arc present bilaterally No apprehension sign  Imaging: [place] image of the L shoulder in both long and short axis obtained. Biceps tendon appears normal fibrillar pattern without surrounding effusion. Subscapularis appears of poor quality without obvious tears or abnormalities. The supraspinatus tendon is poorly visualized and may represent a chronic distal insertional tear with retraction. Consistent fibrillar pattern at insertion is difficult to appreciate.  The infraspinatus and teres minor tendons appear normal with no tears and a good footprints. The subdeltoid/subacromial bursa is unremarkable and shows no impingement with dynamic motion. The before meals joint does appear to have a mushroom sign with minimal narrowing.  Imaging: [place] image of the R shoulder in both long and short axis obtained. Biceps  tendon appears normal fibrillar pattern with a small surrounding effusion which does not appear to encircle the biceps tendon. Subscapularis appears normal without obvious tears or abnormalities. The supraspinatus tendon appears abnormal with a large intrasubstance calcification.  The infraspinatus and teres minor  tendons appear normal with no tears and a good footprints.   Consent obtained and verified. Sterile betadine prep. Furthur cleansed with alcohol. Topical analgesic spray: Ethyl chloride. Joint: Bilateral subacromial injections Approached in typical fashion with: Posteriolateral Completed without difficulty Meds: 1-4 with 40 mg of methylprednisolone and 1% Xylocaine Needle: 25-gauge 1.5 inch Aftercare instructions and Red flags advised.   MEDICATIONS, LABS & OTHER ORDERS: Previous Medications   ACETAMINOPHEN (TYLENOL) 500 MG TABLET    Take 500 mg by mouth every 6 (six) hours as needed.   AMLODIPINE (NORVASC) 5 MG TABLET    Take 1 tablet (5 mg total) by mouth daily. Blood pressure   ATORVASTATIN (LIPITOR) 20 MG TABLET    Take 1 tablet (20 mg total) by mouth daily.   Modified Medications   No medications on file   New Prescriptions   MELOXICAM (MOBIC) 7.5 MG TABLET    Take 1 tablet (7.5 mg total) by mouth daily.   Discontinued Medications   No medications on file  No orders of the defined types were placed in this encounter.   ASSESSMENT & PLAN: Audrey Taylor's history and exam are again consistent with a chronic rotator cuff injury bilaterally. On the right she appears to have a large intratendinous calcification. 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 We performed bilateral injections today. We are also starting her on Mobic daily. She should do rotator cuff exercises as she is able and try to limit anything overhead which causes significant pain. I'm hopeful these interventions will be helpful although considering the chronic nature of her pain she may not ever get full relief from these interventions and may need to consider surgery. We will see her back in 4-6 weeks. Call with any questions in the interim.

## 2015-05-28 MED FILL — SIMVASTATIN 20 MG TABLET: 20 | 30 days supply | Qty: 30 | Fill #2

## 2015-06-10 ENCOUNTER — Encounter: Payer: Self-pay | Admitting: Family Medicine

## 2015-06-10 ENCOUNTER — Ambulatory Visit (INDEPENDENT_AMBULATORY_CARE_PROVIDER_SITE_OTHER): Payer: Medicaid Other | Admitting: Family Medicine

## 2015-06-10 VITALS — BP 120/75 | Ht 67.0 in | Wt 216.0 lb

## 2015-06-10 DIAGNOSIS — M7542 Impingement syndrome of left shoulder: Secondary | ICD-10-CM

## 2015-06-10 DIAGNOSIS — M7531 Calcific tendinitis of right shoulder: Secondary | ICD-10-CM

## 2015-06-11 ENCOUNTER — Encounter: Payer: Self-pay | Admitting: Family Medicine

## 2015-06-11 NOTE — Progress Notes (Signed)
Audrey Taylor - 45 y.o. female MRN [id number]  Date of birth: 06/23/1970  CC: Bilateral shoulder pain  SUBJECTIVE:   HPI  04/01/2015: Mrs. Audrey Taylor is a very pleasant 45 year old female with over 3 years of bilateral shoulder pain. She denies any trauma. She states she only has pain at night when laying on either side or when lifting her arms above her head. She states she did have a time when she could barely lift her arm although she cannot remember which one. Currently she has no limitation to her motion although she has pain when lifting her arms above 90 or when lifting something heavy above 90. She has not had any injections. She does take Tylenol PM which helps her sleep. Day-to-day she has more pain in one or the other, but this is not consistent. She does not feel like she has any weakness. She does not have any loss of dexterity in her hands. She has no pain radiating to her hands. She is not working. In the past she worked as a Water engineerhome health aide. She does take care of her 1163-month-old grandson who she frequently lifts, although this is not painful unless she goes above 90.  04/29/2015: Mrs. Audrey Taylor returns today with her husband for follow-up of bilateral chronic shoulder pain. We provided her with rotator cuff exercises. She was only able to do them for roughly 1 week before she had too much pain. She continues to have any pain lifting her arms above her head. She is also been taking Tylenol PM which helps minimally. She continues to have significant pain when laying on either shoulder at night. As listed above she previously worked as a Water engineerhome health aide and takes care of her 3163-month-old grandson who she is frequently lifting. She's had no new injuries.  Today: Mrs. Audrey Taylor is here today with her grandson, who she takes care of. She states that her left shoulder is doing very well. Her right shoulder is not 100% but the only time she feels pain is during cloudy days and overnight. The pain is 78%  better than prior to her visit just over 6 weeks ago when she had her bilateral subacromial injections. Pain is primarily when she forward flexes or abducts past 90. She is not taking her Mobic were regularly, but several times a week. No new injuries. She is able to do her rotator cuff exercises majority of the time with minimal discomfort.  ROS:     14 point review of systems negative other than that listed above in history of present illness regards to musculoskeletal issue.  HISTORY: Past Medical, Surgical, Social, and Family History Reviewed & Updated per EMR.  Pertinent Historical Findings include: Obesity, hypertension, hyperlipidemia  OBJECTIVE: BP 120/75 mmHg  Ht 5\' 7"  (1.702 m)  Wt 216 lb (97.977 kg)  BMI 33.82 kg/m2  Physical Exam  Calm, no acute distress Nonlabored breathing  Shoulder: b/l Inspection reveals no abnormalities, atrophy or asymmetry. Palpation is normal with no tenderness over AC joint. No palpable tenderness  Range of motion is no longer limited .  Rotator cuff strength 5 out of 5 with minimal pain with full can testing. No longer positive signs of impingement with Neer, Hawkins, and empty can bilaterally. Equivocal speeds and Yergason's on the right Painful arc present bilaterally No apprehension sign  Imaging: None today   MEDICATIONS, LABS & OTHER ORDERS: Previous Medications   ACETAMINOPHEN (TYLENOL) 500 MG TABLET    Take 500 mg by mouth every  6 (six) hours as needed.   AMLODIPINE (NORVASC) 5 MG TABLET    Take 1 tablet (5 mg total) by mouth daily. Blood pressure   ATORVASTATIN (LIPITOR) 20 MG TABLET    Take 1 tablet (20 mg total) by mouth daily.   MELOXICAM (MOBIC) 7.5 MG TABLET    Take 1 tablet (7.5 mg total) by mouth daily.   SIMVASTATIN (ZOCOR) 20 MG TABLET       Modified Medications   No medications on file   New Prescriptions   No medications on file   Discontinued Medications   No medications on file  No orders of the defined types  were placed in this encounter.   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  She has significantly improved after last visit, where she had subacromial injections. Virtually no pain on the left and 80% improvement on the right. She will continue do her exercises and start taking the Mobic daily. She will come back in 6-8 weeks to discuss further options. She has not tried nitroglycerin patches and this may be an option if her progress stalls out. Call with any questions or concerns.

## 2015-07-10 ENCOUNTER — Other Ambulatory Visit: Payer: Self-pay | Admitting: Family Medicine

## 2015-07-10 DIAGNOSIS — Z1231 Encounter for screening mammogram for malignant neoplasm of breast: Secondary | ICD-10-CM

## 2015-07-25 ENCOUNTER — Other Ambulatory Visit: Payer: Self-pay | Admitting: Internal Medicine

## 2015-07-25 NOTE — Telephone Encounter (Signed)
Refill gen. Zocor

## 2015-07-25 NOTE — Telephone Encounter (Signed)
?   Refill Zocor.

## 2015-07-28 MED FILL — ?SIMVASTATIN 20 MG TABLET: 20 MG | 30 days supply | Qty: 30 | Fill #0

## 2015-08-05 ENCOUNTER — Ambulatory Visit: Payer: Medicaid Other | Admitting: Sports Medicine

## 2015-08-07 ENCOUNTER — Ambulatory Visit (INDEPENDENT_AMBULATORY_CARE_PROVIDER_SITE_OTHER): Payer: Medicaid Other | Admitting: Sports Medicine

## 2015-08-07 ENCOUNTER — Ambulatory Visit
Admission: RE | Admit: 2015-08-07 | Discharge: 2015-08-07 | Disposition: A | Discharge status: Home / Self Care | Payer: Medicaid Other | Source: Ambulatory Visit | Attending: Sports Medicine | Admitting: Sports Medicine

## 2015-08-07 ENCOUNTER — Encounter: Payer: Self-pay | Admitting: Sports Medicine

## 2015-08-07 VITALS — BP 136/92 | Ht 67.0 in | Wt 201.0 lb

## 2015-08-07 DIAGNOSIS — M25511 Pain in right shoulder: Secondary | ICD-10-CM | POA: Diagnosis not present

## 2015-08-07 DIAGNOSIS — M7531 Calcific tendinitis of right shoulder: Secondary | ICD-10-CM | POA: Diagnosis not present

## 2015-08-07 NOTE — Progress Notes (Signed)
   Subjective:    Patient ID: Audrey Taylor, female    DOB: Feb 14, 1970, 45 y.o.   MRN: [id number]  HPI   Patient comes in today for follow-up on bilateral shoulder pain. She has been under the care of Dr. Mayford Knife. Overall, her pain has improved but not completely resolved. She has been treated with bilateral subacromial cortisone injections and anti-inflammatories. She has also been given a home exercise program. While she admits that her pain has improved she is still unable to sleep comfortably at night. She still has trouble with holding her hands directly overhead for long periods of time. She also struggles with holding heavy objects away from her body. The anti-inflammatories were not helpful so she changed to Tylenol PM which does help some but causes some morning drowsiness. Again, although her pain has improved, she is still wanting to consider other treatment such as possible surgery.    Review of Systems     Objective:   Physical Exam  Well-developed, well-nourished. No acute distress  Bilateral shoulder exam shows full range of motion with a positive painful arc. Slightly positive empty can, slightly positive Hawkins. Rotator cuff strength is 5/5 bilaterally. No labral signs. Neurovascularly intact distally.  Previous ultrasound evaluation of her right rotator cuff showed a large intratendinous calcification       Assessment & Plan:   Persistent bilateral shoulder pain, right greater than left, due to calcific tendinopathy  Patient would like to consider her surgical options. Therefore, we will pursue further imaging in the form of x-rays and MRI. Phone follow-up after those studies to discuss the results and delineate possible surgical options.

## 2015-08-13 ENCOUNTER — Encounter: Payer: Self-pay | Admitting: Internal Medicine

## 2015-08-13 ENCOUNTER — Ambulatory Visit: Payer: Medicaid Other | Attending: Internal Medicine | Admitting: Internal Medicine

## 2015-08-13 VITALS — BP 126/88 | HR 77 | Temp 97.9°F | Resp 16 | Wt 211.6 lb

## 2015-08-13 DIAGNOSIS — Z803 Family history of malignant neoplasm of breast: Secondary | ICD-10-CM | POA: Insufficient documentation

## 2015-08-13 DIAGNOSIS — E785 Hyperlipidemia, unspecified: Secondary | ICD-10-CM | POA: Insufficient documentation

## 2015-08-13 DIAGNOSIS — I10 Essential (primary) hypertension: Secondary | ICD-10-CM | POA: Insufficient documentation

## 2015-08-13 DIAGNOSIS — Z114 Encounter for screening for human immunodeficiency virus [HIV]: Secondary | ICD-10-CM | POA: Diagnosis not present

## 2015-08-13 DIAGNOSIS — Z8249 Family history of ischemic heart disease and other diseases of the circulatory system: Secondary | ICD-10-CM | POA: Insufficient documentation

## 2015-08-13 DIAGNOSIS — Z131 Encounter for screening for diabetes mellitus: Secondary | ICD-10-CM | POA: Diagnosis not present

## 2015-08-13 DIAGNOSIS — Z79899 Other long term (current) drug therapy: Secondary | ICD-10-CM | POA: Diagnosis not present

## 2015-08-13 DIAGNOSIS — Z791 Long term (current) use of non-steroidal anti-inflammatories (NSAID): Secondary | ICD-10-CM | POA: Diagnosis not present

## 2015-08-13 DIAGNOSIS — E669 Obesity, unspecified: Secondary | ICD-10-CM | POA: Diagnosis not present

## 2015-08-13 DIAGNOSIS — Z1239 Encounter for other screening for malignant neoplasm of breast: Secondary | ICD-10-CM

## 2015-08-13 DIAGNOSIS — M25512 Pain in left shoulder: Secondary | ICD-10-CM | POA: Diagnosis not present

## 2015-08-13 DIAGNOSIS — Z1231 Encounter for screening mammogram for malignant neoplasm of breast: Secondary | ICD-10-CM | POA: Diagnosis not present

## 2015-08-13 DIAGNOSIS — N63 Unspecified lump in breast: Secondary | ICD-10-CM | POA: Insufficient documentation

## 2015-08-13 DIAGNOSIS — Z880 Allergy status to penicillin: Secondary | ICD-10-CM | POA: Insufficient documentation

## 2015-08-13 DIAGNOSIS — M25511 Pain in right shoulder: Secondary | ICD-10-CM | POA: Insufficient documentation

## 2015-08-13 DIAGNOSIS — N920 Excessive and frequent menstruation with regular cycle: Secondary | ICD-10-CM | POA: Diagnosis not present

## 2015-08-13 DIAGNOSIS — E781 Pure hyperglyceridemia: Secondary | ICD-10-CM | POA: Diagnosis not present

## 2015-08-13 LAB — CBC WITH DIFFERENTIAL/PLATELET
BASOS ABS: 66 {cells}/uL (ref 0–200)
Basophils Relative: 1 %
Eosinophils Absolute: 66 cells/uL (ref 15–500)
Eosinophils Relative: 1 %
HCT: 41.1 % (ref 35.0–45.0)
HEMOGLOBIN: 13.5 g/dL (ref 11.7–15.5)
Lymphocytes Relative: 41 %
Lymphs Abs: 2706 cells/uL (ref 850–3900)
MCH: 28.2 pg (ref 27.0–33.0)
MCHC: 32.8 g/dL (ref 32.0–36.0)
MCV: 86 fL (ref 80.0–100.0)
MONOS PCT: 7 %
MPV: 10.8 fL (ref 7.5–12.5)
Monocytes Absolute: 462 cells/uL (ref 200–950)
NEUTROS ABS: 3300 {cells}/uL (ref 1500–7800)
Neutrophils Relative %: 50 %
PLATELETS: 275 10*3/uL (ref 140–400)
RBC: 4.78 MIL/uL (ref 3.80–5.10)
RDW: 15.6 % — ABNORMAL HIGH (ref 11.0–15.0)
WBC: 6.6 10*3/uL (ref 3.8–10.8)

## 2015-08-13 LAB — POCT GLYCOSYLATED HEMOGLOBIN (HGB A1C): HEMOGLOBIN A1C: 5.6

## 2015-08-13 NOTE — Patient Instructions (Signed)
Hypertension Hypertension is another name for high blood pressure. High blood pressure forces your heart to work harder to pump blood. A blood pressure reading has two numbers, which includes a higher number over a lower number (example: 110/72). HOME CARE   Have your blood pressure rechecked by your doctor.  Only take medicine as told by your doctor. Follow the directions carefully. The medicine does not work as well if you skip doses. Skipping doses also puts you at risk for problems.  Do not smoke.  Monitor your blood pressure at home as told by your doctor. GET HELP IF:  You think you are having a reaction to the medicine you are taking.  You have repeat headaches or feel dizzy.  You have puffiness (swelling) in your ankles.  You have trouble with your vision. GET HELP RIGHT AWAY IF:   You get a very bad headache and are confused.  You feel weak, numb, or faint.  You get chest or belly (abdominal) pain.  You throw up (vomit).  You cannot breathe very well. MAKE SURE YOU:   Understand these instructions.  Will watch your condition.  Will get help right away if you are not doing well or get worse.   This information is not intended to replace advice given to you by your health care provider. Make sure you discuss any questions you have with your health care provider.   Document Released: 06/09/2007 Document Revised: 12/26/2012 Document Reviewed: 10/13/2012 Elsevier Interactive Patient Education 2016 Elsevier Inc.   -  Low-Sodium Eating Plan Sodium raises blood pressure and causes water to be held in the body. Getting less sodium from food will help lower your blood pressure, reduce any swelling, and protect your heart, liver, and kidneys. We get sodium by adding salt (sodium chloride) to food. Most of our sodium comes from canned, boxed, and frozen foods. Restaurant foods, fast foods, and pizza are also very high in sodium. Even if you take medicine to lower your  blood pressure or to reduce fluid in your body, getting less sodium from your food is important. WHAT IS MY PLAN? Most people should limit their sodium intake to 2,300 mg a day. Your health care provider recommends that you limit your sodium intake to  < 2 gram a day.  WHAT DO I NEED TO KNOW ABOUT THIS EATING PLAN? For the low-sodium eating plan, you will follow these general guidelines:  Choose foods with a % Daily Value for sodium of less than 5% (as listed on the food label).   Use salt-free seasonings or herbs instead of table salt or sea salt.   Check with your health care provider or pharmacist before using salt substitutes.   Eat fresh foods.  Eat more vegetables and fruits.  Limit canned vegetables. If you do use them, rinse them well to decrease the sodium.   Limit cheese to 1 oz (28 g) per day.   Eat lower-sodium products, often labeled as "lower sodium" or "no salt added."  Avoid foods that contain monosodium glutamate (MSG). MSG is sometimes added to [place] food and some canned foods.  Check food labels (Nutrition Facts labels) on foods to learn how much sodium is in one serving.  Eat more home-cooked food and less restaurant, buffet, and fast food.  When eating at a restaurant, ask that your food be prepared with less salt, or no salt if possible.  HOW DO I READ FOOD LABELS FOR SODIUM INFORMATION? The Nutrition Facts label lists the  amount of sodium in one serving of the food. If you eat more than one serving, you must multiply the listed amount of sodium by the number of servings. Food labels may also identify foods as:  Sodium free--Less than 5 mg in a serving.  Very low sodium--35 mg or less in a serving.  Low sodium--140 mg or less in a serving.  Light in sodium--50% less sodium in a serving. For example, if a food that usually has 300 mg of sodium is changed to become light in sodium, it will have 150 mg of sodium.  Reduced sodium--25% less sodium  in a serving. For example, if a food that usually has 400 mg of sodium is changed to reduced sodium, it will have 300 mg of sodium. WHAT FOODS CAN I EAT? Grains Low-sodium cereals, including oats, puffed wheat and rice, and shredded wheat cereals. Low-sodium crackers. Unsalted rice and pasta. Lower-sodium bread.  Vegetables Frozen or fresh vegetables. Low-sodium or reduced-sodium canned vegetables. Low-sodium or reduced-sodium tomato sauce and paste. Low-sodium or reduced-sodium tomato and vegetable juices.  Fruits Fresh, frozen, and canned fruit. Fruit juice.  Meat and Other Protein Products Low-sodium canned tuna and salmon. Fresh or frozen meat, poultry, seafood, and fish. Lamb. Unsalted nuts. Dried beans, peas, and lentils without added salt. Unsalted canned beans. Homemade soups without salt. Eggs.  Dairy Milk. Soy milk. Ricotta cheese. Low-sodium or reduced-sodium cheeses. Yogurt.  Condiments Fresh and dried herbs and spices. Salt-free seasonings. Onion and garlic powders. Low-sodium varieties of mustard and ketchup. Fresh or refrigerated horseradish. Lemon juice.  Fats and Oils Reduced-sodium salad dressings. Unsalted butter.  Other Unsalted popcorn and pretzels.  The items listed above may not be a complete list of recommended foods or beverages. Contact your dietitian for more options. WHAT FOODS ARE NOT RECOMMENDED? Grains Instant hot cereals. Bread stuffing, pancake, and biscuit mixes. Croutons. Seasoned rice or pasta mixes. Noodle soup cups. Boxed or frozen macaroni and cheese. Self-rising flour. Regular salted crackers. Vegetables Regular canned vegetables. Regular canned tomato sauce and paste. Regular tomato and vegetable juices. Frozen vegetables in sauces. Salted [place] fries. Olives. Rosita Fire. Relishes. Sauerkraut. Salsa. Meat and Other Protein Products Salted, canned, smoked, spiced, or pickled meats, seafood, or fish. Bacon, ham, sausage, hot dogs, corned  beef, chipped beef, and packaged luncheon meats. Salt pork. Jerky. Pickled herring. Anchovies, regular canned tuna, and sardines. Salted nuts. Dairy Processed cheese and cheese spreads. Cheese curds. Blue cheese and cottage cheese. Buttermilk.  Condiments Onion and garlic salt, seasoned salt, table salt, and sea salt. Canned and packaged gravies. Worcestershire sauce. Tartar sauce. Barbecue sauce. Teriyaki sauce. Soy sauce, including reduced sodium. Steak sauce. Fish sauce. Oyster sauce. Cocktail sauce. Horseradish that you find on the shelf. Regular ketchup and mustard. Meat flavorings and tenderizers. Bouillon cubes. Hot sauce. Tabasco sauce. Marinades. Taco seasonings. Relishes. Fats and Oils Regular salad dressings. Salted butter. Margarine. Ghee. Bacon fat.  Other Potato and tortilla chips. Corn chips and puffs. Salted popcorn and pretzels. Canned or dried soups. Pizza. Frozen entrees and pot pies.  The items listed above may not be a complete list of foods and beverages to avoid. Contact your dietitian for more information.   This information is not intended to replace advice given to you by your health care provider. Make sure you discuss any questions you have with your health care provider.   Document Released: 06/12/2001 Document Revised: 01/11/2014 Document Reviewed: 10/25/2012 Elsevier Interactive Patient Education [employer]

## 2015-08-13 NOTE — Progress Notes (Signed)
Audrey Taylor, is a 45 y.o. female  [id number]  [id number]  DOB - 02/03/1970  CC:  Chief Complaint  Patient presents with  . Establish Care       HPI: Audrey Taylor is a 45 y.o. female here today to establish medical care. Last seen in clinic 3/17, with significant pmhx htn, hld, and chronic bilateral shoulder pains followed by Ortho.  Per pt has xrays and mri of shoulder, right pending this Monday.  She does not smoke or drink, +sedentary lifestyle.  Not watching her salt or diet as she should.   Patient has No headache, No chest pain, No abdominal pain - No Nausea, No new weakness tingling or numbness, No Cough - SOB. bms normal caliber.   Menstrual spotting at times, last time 3-4 months ago.  She is here w/ her husband.  Review of Systems: Per HPI, o/w all systems reviewed and negative.    Allergies  Allergen Reactions  . Penicillins Other (See Comments)    Childhood allergy   Past Medical History:  Diagnosis Date  . Hyperlipidemia   . Menorrhagia   . Obesity   . Shoulder pain    Current Outpatient Prescriptions on File Prior to Visit  Medication Sig Dispense Refill  . acetaminophen (TYLENOL) 500 MG tablet Take 500 mg by mouth every 6 (six) hours as needed.    Marland Kitchen. amLODipine (NORVASC) 5 MG tablet Take 1 tablet (5 mg total) by mouth daily. Blood pressure 30 tablet 3  . simvastatin (ZOCOR) 20 MG tablet TAKE 1 TABLET BY MOUTH AT BEDTIME FOR CHOLESTEROL 30 tablet 2   No current facility-administered medications on file prior to visit.    Family History  Problem Relation Age of Onset  . Breast cancer Mother   . Hypertension Father    Social History   Social History  . Marital status: Married    Spouse name: N/A  . Number of children: N/A  . Years of education: N/A   Occupational History  . Not on file.   Social History Main Topics  . Smoking status: Never Smoker  . Smokeless tobacco: Never Used  . Alcohol use No  . Drug use: No  . Sexual  activity: Yes    Birth control/ protection: Surgical   Other Topics Concern  . Not on file   Social History Narrative  . No narrative on file    Objective:   Vitals:   08/13/15 0927  BP: 126/88  Pulse: 77  Resp: 16  Temp: 97.9 F (36.6 C)    Filed Weights   08/13/15 0927  Weight: 211 lb 9.6 oz (96 kg)    BP Readings from Last 3 Encounters:  08/13/15 126/88  08/07/15 (!) 136/92  06/10/15 120/75    Physical Exam: Constitutional: Patient appears well-developed and well-nourished. No distress. AAOx3, obese, pleasant HENT: Normocephalic, atraumatic, External right and left ear normal. Oropharynx is clear and moist. bilat TMs clear Eyes: Conjunctivae and EOM are normal. PERRL, no scleral icterus. Neck: Normal ROM. Neck supple. No JVD. CVS: RRR, S1/S2 +, no murmurs, no gallops, no carotid bruit.  Pulmonary: Effort and breath sounds normal, no stridor, rhonchi, wheezes, rales.  Abdominal: Soft. BS +, obese, no distension, tenderness, rebound or guarding.  Musculoskeletal: Normal range of motion. No edema and no tenderness.  LE: bilat/ no c/c/e, pulses 2+ bilateral.  Neuro: Alert. muscle tone coordination wnl. No cranial nerve deficit grossly. Skin: Skin is warm and dry. No rash noted. Not diaphoretic. No  erythema. No pallor. Psychiatric: Normal mood and affect. Behavior, judgment, thought content normal.  Lab Results  Component Value Date   WBC 8.3 05/29/2013   HGB 14.1 05/29/2013   HCT 41.5 05/29/2013   MCV 83.3 05/29/2013   PLT 283 05/29/2013   Lab Results  Component Value Date   CREATININE 0.84 04/03/2015   BUN 9 04/03/2015   NA 139 04/03/2015   K 4.4 04/03/2015   CL 104 04/03/2015   CO2 24 04/03/2015    Lab Results  Component Value Date   HGBA1C 5.6 08/13/2015   Lipid Panel     Component Value Date/Time   CHOL 229 (H) 04/03/2015 1000   TRIG 229 (H) 04/03/2015 1000   HDL 43 (L) 04/03/2015 1000   CHOLHDL 5.3 (H) 04/03/2015 1000   VLDL 46 (H)  04/03/2015 1000   LDLCALC 140 (H) 04/03/2015 1000       Depression screen [facility] 2/9 08/13/2015 08/07/2015 06/10/2015 04/01/2015 03/19/2015  Decreased Interest 0 0 0 0 0  Down, Depressed, Hopeless 0 0 0 0 0  PHQ - 2 Score 0 0 0 0 0    7/16 mm FINDINGS: There are no findings suspicious for malignancy. Images were processed with CAD.  IMPRESSION: No mammographic evidence of malignancy. A result letter of this screening mammogram will be mailed directly to the patient.  RECOMMENDATION: Screening mammogram in one year. (Code:[id number]-[id number])  BI-RADS CATEGORY  1: Negative.   Electronically Signed   By: Dalphine Handing M.D.   On: 07/24/2014 11:02   11/2013 MM IMPRESSION: 4.8 cm simple left breast cysts, corresponding to the mass felt by the patient. This has decreased in size since June of this year. No evidence of malignancy. Assessment and plan:   1. HYPERTRIGLYCERIDEMIA Fasting, rechk may need to change to atorvastatin 20qhs pending labs. - Lipid Panel  2. HTN (hypertension), benign Controlled, recd low salt diet, increase exercise/walking/swimming. - CBC with Differential  3. Diabetes mellitus screening - POCT glycosylated hemoglobin (Hb A1C) 5.6  4. Encounter for screening for HIV - HIV antibody (with reflex)  5. Breast cancer screening, hx of left symple cyst prior in  - MM Digital Screening; Future   Return in about 3 months (around 11/13/2015).  The patient was given clear instructions to go to ER or return to medical center if symptoms don't improve, worsen or new problems develop. The patient verbalized understanding. The patient was told to call to get lab results if they haven't heard anything in the next week.    This note has been created with Education officer, environmental. Any transcriptional errors are unintentional.   Pete Glatter, MD, MBA/MHA [facility] [place], [place] [phone]     08/13/2015, 11:00 AM

## 2015-08-14 LAB — LIPID PANEL
Cholesterol: 173 mg/dL (ref 125–200)
HDL: 51 mg/dL (ref >=46)
LDL CALC: 101 mg/dL (ref <130)
Total CHOL/HDL Ratio: 3.4 Ratio (ref <=5.0)
Triglycerides: 107 mg/dL (ref <150)
VLDL: 21 mg/dL (ref <30)

## 2015-08-14 LAB — HIV ANTIBODY (ROUTINE TESTING W REFLEX): HIV 1&2 Ab, 4th Generation: NONREACTIVE

## 2015-08-18 ENCOUNTER — Ambulatory Visit
Admission: RE | Admit: 2015-08-18 | Discharge: 2015-08-18 | Disposition: A | Discharge status: Home / Self Care | Payer: Medicaid Other | Source: Ambulatory Visit | Attending: Sports Medicine | Admitting: Sports Medicine

## 2015-08-18 DIAGNOSIS — M25511 Pain in right shoulder: Secondary | ICD-10-CM

## 2015-08-19 ENCOUNTER — Telehealth: Payer: Self-pay | Admitting: Sports Medicine

## 2015-08-19 ENCOUNTER — Telehealth: Payer: Self-pay

## 2015-08-19 ENCOUNTER — Other Ambulatory Visit: Payer: Self-pay | Admitting: *Deleted

## 2015-08-19 NOTE — Telephone Encounter (Signed)
Contacted pt to go over lab results. Pt is aware of results and doesn't have any questions or concerns 

## 2015-08-19 NOTE — Addendum Note (Signed)
Addended byFrankey Poot: Nikkol Pai N on: 08/19/2015 05:19 PM   Modules accepted: Orders

## 2015-08-19 NOTE — Telephone Encounter (Signed)
I spoke with the patient on the phone today after reviewing the MRI of her right shoulder. 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 She also has partial tearing of the subscapularis and teres minor tendons. I've recommended consultation with Dr. Dion SaucierLandau to discuss further treatment. Further workup and treatment will be per the discretion of Dr. Dion SaucierLandau and the patient will follow-up with me as needed.

## 2015-08-26 ENCOUNTER — Other Ambulatory Visit: Payer: Self-pay | Admitting: Orthopedic Surgery

## 2015-08-26 ENCOUNTER — Encounter (HOSPITAL_BASED_OUTPATIENT_CLINIC_OR_DEPARTMENT_OTHER): Payer: Self-pay | Admitting: *Deleted

## 2015-08-27 ENCOUNTER — Encounter (HOSPITAL_BASED_OUTPATIENT_CLINIC_OR_DEPARTMENT_OTHER)
Admission: RE | Admit: 2015-08-27 | Discharge: 2015-08-27 | Disposition: A | Discharge status: Home / Self Care | Payer: Medicaid Other | Source: Ambulatory Visit | Attending: Orthopedic Surgery | Admitting: Orthopedic Surgery

## 2015-08-27 ENCOUNTER — Other Ambulatory Visit: Payer: Self-pay

## 2015-08-27 DIAGNOSIS — Z79899 Other long term (current) drug therapy: Secondary | ICD-10-CM | POA: Diagnosis not present

## 2015-08-27 DIAGNOSIS — Z6833 Body mass index (BMI) 33.0-33.9, adult: Secondary | ICD-10-CM | POA: Diagnosis not present

## 2015-08-27 DIAGNOSIS — E669 Obesity, unspecified: Secondary | ICD-10-CM | POA: Diagnosis not present

## 2015-08-27 DIAGNOSIS — M25311 Other instability, right shoulder: Secondary | ICD-10-CM | POA: Diagnosis not present

## 2015-08-27 DIAGNOSIS — I10 Essential (primary) hypertension: Secondary | ICD-10-CM | POA: Diagnosis not present

## 2015-08-27 DIAGNOSIS — E785 Hyperlipidemia, unspecified: Secondary | ICD-10-CM | POA: Diagnosis not present

## 2015-08-27 DIAGNOSIS — M7541 Impingement syndrome of right shoulder: Secondary | ICD-10-CM | POA: Diagnosis not present

## 2015-08-27 DIAGNOSIS — M75121 Complete rotator cuff tear or rupture of right shoulder, not specified as traumatic: Secondary | ICD-10-CM | POA: Diagnosis not present

## 2015-08-28 ENCOUNTER — Ambulatory Visit (HOSPITAL_BASED_OUTPATIENT_CLINIC_OR_DEPARTMENT_OTHER): Payer: Medicaid Other | Admitting: Anesthesiology

## 2015-08-28 ENCOUNTER — Encounter (HOSPITAL_BASED_OUTPATIENT_CLINIC_OR_DEPARTMENT_OTHER): Payer: Self-pay

## 2015-08-28 ENCOUNTER — Ambulatory Visit (HOSPITAL_BASED_OUTPATIENT_CLINIC_OR_DEPARTMENT_OTHER)
Admission: RE | Admit: 2015-08-28 | Discharge: 2015-08-28 | Disposition: A | Discharge status: Home / Self Care | Payer: Medicaid Other | Source: Ambulatory Visit | Attending: Orthopedic Surgery | Admitting: Orthopedic Surgery

## 2015-08-28 ENCOUNTER — Encounter (HOSPITAL_BASED_OUTPATIENT_CLINIC_OR_DEPARTMENT_OTHER)
Admission: RE | Disposition: A | Discharge status: Home / Self Care | Payer: Self-pay | Source: Ambulatory Visit | Attending: Orthopedic Surgery

## 2015-08-28 DIAGNOSIS — Z79899 Other long term (current) drug therapy: Secondary | ICD-10-CM | POA: Insufficient documentation

## 2015-08-28 DIAGNOSIS — E785 Hyperlipidemia, unspecified: Secondary | ICD-10-CM | POA: Insufficient documentation

## 2015-08-28 DIAGNOSIS — E669 Obesity, unspecified: Secondary | ICD-10-CM | POA: Insufficient documentation

## 2015-08-28 DIAGNOSIS — M75121 Complete rotator cuff tear or rupture of right shoulder, not specified as traumatic: Secondary | ICD-10-CM | POA: Diagnosis present

## 2015-08-28 DIAGNOSIS — M25311 Other instability, right shoulder: Secondary | ICD-10-CM | POA: Diagnosis not present

## 2015-08-28 DIAGNOSIS — I10 Essential (primary) hypertension: Secondary | ICD-10-CM | POA: Insufficient documentation

## 2015-08-28 DIAGNOSIS — Z6833 Body mass index (BMI) 33.0-33.9, adult: Secondary | ICD-10-CM | POA: Insufficient documentation

## 2015-08-28 DIAGNOSIS — M7541 Impingement syndrome of right shoulder: Secondary | ICD-10-CM | POA: Insufficient documentation

## 2015-08-28 HISTORY — DX: Complete rotator cuff tear or rupture of right shoulder, not specified as traumatic: M75.121

## 2015-08-28 HISTORY — PX: SHOULDER ACROMIOPLASTY: SHX6093

## 2015-08-28 HISTORY — PX: SHOULDER ARTHROSCOPY WITH ROTATOR CUFF REPAIR: SHX5685

## 2015-08-28 HISTORY — DX: Essential (primary) hypertension: I10

## 2015-08-28 SURGERY — ARTHROSCOPY, SHOULDER, WITH ROTATOR CUFF REPAIR
Anesthesia: General | Site: Shoulder | Laterality: Right

## 2015-08-28 MED ORDER — PROPOFOL 10 MG/ML IV BOLUS
INTRAVENOUS | Status: AC
  Filled 2015-08-28: qty 20

## 2015-08-28 MED ORDER — PROPOFOL 10 MG/ML IV BOLUS
INTRAVENOUS | Status: DC | PRN
  Administered 2015-08-28: 200 mg via INTRAVENOUS

## 2015-08-28 MED ORDER — SUCCINYLCHOLINE CHLORIDE 200 MG/10ML IV SOSY
PREFILLED_SYRINGE | INTRAVENOUS | Status: AC
  Filled 2015-08-28: qty 10

## 2015-08-28 MED ORDER — ONDANSETRON HCL 4 MG/2ML IJ SOLN
INTRAMUSCULAR | Status: AC
  Filled 2015-08-28: qty 2

## 2015-08-28 MED ORDER — LIDOCAINE 2% (20 MG/ML) 5 ML SYRINGE
INTRAMUSCULAR | Status: AC
  Filled 2015-08-28: qty 5

## 2015-08-28 MED ORDER — MIDAZOLAM HCL 2 MG/2ML IJ SOLN
INTRAMUSCULAR | Status: AC
  Filled 2015-08-28: qty 2

## 2015-08-28 MED ORDER — BUPIVACAINE-EPINEPHRINE (PF) 0.5% -1:200000 IJ SOLN
INTRAMUSCULAR | Status: DC | PRN
  Administered 2015-08-28: 30 mL

## 2015-08-28 MED ORDER — BACLOFEN 10 MG PO TABS: 10.0000 mg | ORAL_TABLET | Freq: Three times a day (TID) | ORAL | 0 refills | Status: DC

## 2015-08-28 MED ORDER — DEXAMETHASONE SODIUM PHOSPHATE 4 MG/ML IJ SOLN
INTRAMUSCULAR | Status: DC | PRN
  Administered 2015-08-28: 10 mg via INTRAVENOUS

## 2015-08-28 MED ORDER — LIDOCAINE HCL (CARDIAC) 20 MG/ML IV SOLN
INTRAVENOUS | Status: DC | PRN
  Administered 2015-08-28: 50 mg via INTRAVENOUS

## 2015-08-28 MED ORDER — CEFAZOLIN SODIUM-DEXTROSE 2-4 GM/100ML-% IV SOLN
2.0000 g | INTRAVENOUS | Status: AC
  Administered 2015-08-28: 2 g via INTRAVENOUS

## 2015-08-28 MED ORDER — SENNA-DOCUSATE SODIUM 8.6-50 MG PO TABS: 2.0000 | ORAL_TABLET | Freq: Every day | ORAL | 1 refills | Status: DC

## 2015-08-28 MED ORDER — PROPOFOL 500 MG/50ML IV EMUL
INTRAVENOUS | Status: AC
  Filled 2015-08-28: qty 50

## 2015-08-28 MED ORDER — SCOPOLAMINE 1 MG/3DAYS TD PT72: 1.0000 | MEDICATED_PATCH | Freq: Once | TRANSDERMAL | Status: DC | PRN

## 2015-08-28 MED ORDER — GLYCOPYRROLATE 0.2 MG/ML IJ SOLN: 0.2000 mg | Freq: Once | INTRAMUSCULAR | Status: DC | PRN

## 2015-08-28 MED ORDER — SUCCINYLCHOLINE CHLORIDE 20 MG/ML IJ SOLN
INTRAMUSCULAR | Status: DC | PRN
  Administered 2015-08-28: 100 mg via INTRAVENOUS

## 2015-08-28 MED ORDER — FENTANYL CITRATE (PF) 100 MCG/2ML IJ SOLN
INTRAMUSCULAR | Status: AC
  Filled 2015-08-28: qty 2

## 2015-08-28 MED ORDER — SODIUM CHLORIDE 0.9 % IR SOLN
Status: DC | PRN
  Administered 2015-08-28: 12000 mL

## 2015-08-28 MED ORDER — MIDAZOLAM HCL 2 MG/2ML IJ SOLN
1.0000 mg | INTRAMUSCULAR | Status: DC | PRN
  Administered 2015-08-28: 2 mg via INTRAVENOUS

## 2015-08-28 MED ORDER — LACTATED RINGERS IV SOLN
INTRAVENOUS | Status: DC
  Administered 2015-08-28: 10 mL/h via INTRAVENOUS
  Administered 2015-08-28: 10:00:00 via INTRAVENOUS

## 2015-08-28 MED ORDER — ONDANSETRON HCL 4 MG PO TABS: 4.0000 mg | ORAL_TABLET | Freq: Three times a day (TID) | ORAL | 0 refills | Status: DC | PRN

## 2015-08-28 MED ORDER — DEXAMETHASONE SODIUM PHOSPHATE 10 MG/ML IJ SOLN
INTRAMUSCULAR | Status: AC
  Filled 2015-08-28: qty 1

## 2015-08-28 MED ORDER — FENTANYL CITRATE (PF) 100 MCG/2ML IJ SOLN
50.0000 ug | INTRAMUSCULAR | Status: AC | PRN
  Administered 2015-08-28: 25 ug via INTRAVENOUS
  Administered 2015-08-28: 50 ug via INTRAVENOUS
  Administered 2015-08-28: 25 ug via INTRAVENOUS
  Administered 2015-08-28: 50 ug via INTRAVENOUS

## 2015-08-28 MED ORDER — CEFAZOLIN SODIUM-DEXTROSE 2-4 GM/100ML-% IV SOLN
INTRAVENOUS | Status: AC
  Filled 2015-08-28: qty 100

## 2015-08-28 MED ORDER — ONDANSETRON HCL 4 MG/2ML IJ SOLN
INTRAMUSCULAR | Status: DC | PRN
  Administered 2015-08-28: 4 mg via INTRAVENOUS

## 2015-08-28 MED ORDER — OXYCODONE-ACETAMINOPHEN 10-325 MG PO TABS: 1.0000 | ORAL_TABLET | Freq: Four times a day (QID) | ORAL | 0 refills | Status: DC | PRN

## 2015-08-28 SURGICAL SUPPLY — 66 items
ANCHOR SUT BIO SW 4.75X19.1 (Anchor) ×9 IMPLANT
BLADE CUTTER GATOR 3.5 (BLADE) ×3 IMPLANT
BLADE GREAT WHITE 4.2 (BLADE) IMPLANT
BLADE GREAT WHITE 4.2MM (BLADE)
BLADE SURG 15 STRL LF DISP TIS (BLADE) IMPLANT
BLADE SURG 15 STRL SS (BLADE)
BUR OVAL 6.0 (BURR) ×3 IMPLANT
CANNULA 5.75X71 LONG (CANNULA) ×3 IMPLANT
CANNULA TWIST IN 8.25X7CM (CANNULA) ×3 IMPLANT
CLOSURE STERI-STRIP 1/2X4 (GAUZE/BANDAGES/DRESSINGS) ×1
CLSR STERI-STRIP ANTIMIC 1/2X4 (GAUZE/BANDAGES/DRESSINGS) ×2 IMPLANT
DECANTER SPIKE VIAL GLASS SM (MISCELLANEOUS) IMPLANT
DRAPE IMP U-DRAPE 54X76 (DRAPES) ×3 IMPLANT
DRAPE INCISE IOBAN 66X45 STRL (DRAPES) ×3 IMPLANT
DRAPE SHOULDER BEACH CHAIR (DRAPES) ×3 IMPLANT
DRAPE U-SHAPE 47X51 STRL (DRAPES) ×3 IMPLANT
DRSG PAD ABDOMINAL 8X10 ST (GAUZE/BANDAGES/DRESSINGS) ×3 IMPLANT
DURAPREP 26ML APPLICATOR (WOUND CARE) ×3 IMPLANT
ELECT REM PT RETURN 9FT ADLT (ELECTROSURGICAL)
ELECTRODE REM PT RTRN 9FT ADLT (ELECTROSURGICAL) IMPLANT
FIBERSTICK 2 (SUTURE) IMPLANT
GAUZE SPONGE 4X4 12PLY STRL (GAUZE/BANDAGES/DRESSINGS) ×3 IMPLANT
GLOVE BIO SURGEON STRL SZ8 (GLOVE) ×3 IMPLANT
GLOVE BIOGEL PI IND STRL 7.0 (GLOVE) ×3 IMPLANT
GLOVE BIOGEL PI IND STRL 8 (GLOVE) ×2 IMPLANT
GLOVE BIOGEL PI INDICATOR 7.0 (GLOVE) ×6
GLOVE BIOGEL PI INDICATOR 8 (GLOVE) ×4
GLOVE ECLIPSE 6.5 STRL STRAW (GLOVE) ×6 IMPLANT
GLOVE ORTHO TXT STRL SZ7.5 (GLOVE) ×3 IMPLANT
GOWN STRL REUS W/ TWL LRG LVL3 (GOWN DISPOSABLE) ×2 IMPLANT
GOWN STRL REUS W/ TWL XL LVL3 (GOWN DISPOSABLE) ×2 IMPLANT
GOWN STRL REUS W/TWL LRG LVL3 (GOWN DISPOSABLE) ×4
GOWN STRL REUS W/TWL XL LVL3 (GOWN DISPOSABLE) ×4
IMMOBILIZER SHOULDER FOAM XLGE (SOFTGOODS) IMPLANT
IV NS IRRIG 3000ML ARTHROMATIC (IV SOLUTION) ×15 IMPLANT
KIT SHOULDER TRACTION (DRAPES) ×3 IMPLANT
LASSO 90 CVE QUICKPAS (DISPOSABLE) ×3 IMPLANT
MANIFOLD NEPTUNE II (INSTRUMENTS) ×3 IMPLANT
NEEDLE SCORPION MULTI FIRE (NEEDLE) IMPLANT
PACK ARTHROSCOPY DSU (CUSTOM PROCEDURE TRAY) ×3 IMPLANT
PACK BASIN DAY SURGERY FS (CUSTOM PROCEDURE TRAY) ×3 IMPLANT
SET ARTHROSCOPY TUBING (MISCELLANEOUS) ×2
SET ARTHROSCOPY TUBING LN (MISCELLANEOUS) ×1 IMPLANT
SHEET MEDIUM DRAPE 40X70 STRL (DRAPES) ×3 IMPLANT
SLEEVE SCD COMPRESS KNEE MED (MISCELLANEOUS) ×3 IMPLANT
SLING ARM FOAM STRAP LRG (SOFTGOODS) IMPLANT
SLING ARM IMMOBILIZER LRG (SOFTGOODS) ×3 IMPLANT
SLING ARM IMMOBILIZER MED (SOFTGOODS) IMPLANT
SLING ARM MED ADULT FOAM STRAP (SOFTGOODS) IMPLANT
SLING ARM XL FOAM STRAP (SOFTGOODS) IMPLANT
SUT FIBERWIRE #2 38 T-5 BLUE (SUTURE)
SUT MNCRL AB 4-0 PS2 18 (SUTURE) ×3 IMPLANT
SUT PDS AB 0 CT 36 (SUTURE) ×3 IMPLANT
SUT PDS AB 1 CT  36 (SUTURE)
SUT PDS AB 1 CT 36 (SUTURE) IMPLANT
SUT TIGER TAPE 7 IN WHITE (SUTURE) ×6 IMPLANT
SUT VIC AB 3-0 SH 27 (SUTURE)
SUT VIC AB 3-0 SH 27X BRD (SUTURE) IMPLANT
SUTURE FIBERWR #2 38 T-5 BLUE (SUTURE) IMPLANT
TAPE FIBER 2MM 7IN #2 BLUE (SUTURE) ×3 IMPLANT
TOWEL OR 17X24 6PK STRL BLUE (TOWEL DISPOSABLE) ×3 IMPLANT
TOWEL OR NON WOVEN STRL DISP B (DISPOSABLE) ×3 IMPLANT
TUBE CONNECTING 20'X1/4 (TUBING)
TUBE CONNECTING 20X1/4 (TUBING) IMPLANT
WAND STAR VAC 90 (SURGICAL WAND) ×3 IMPLANT
WATER STERILE IRR 1000ML POUR (IV SOLUTION) ×3 IMPLANT

## 2015-08-28 NOTE — Discharge Instructions (Signed)
Diet: As you were doing prior to hospitalization  ° °Shower:  May shower but keep the wounds dry, use an occlusive plastic wrap, NO SOAKING IN TUB.  If the bandage gets wet, change with a clean dry gauze.  If you have a splint on, leave the splint in place and keep the splint dry with a plastic bag. ° °Dressing:  You may change your dressing 3-5 days after surgery, unless you have a splint.  If you have a splint, then just leave the splint in place and we will change your bandages during your first follow-up appointment.   ° °If you had hand or foot surgery, we will plan to remove your stitches in about 2 weeks in the office.  For all other surgeries, there are sticky tapes (steri-strips) on your wounds and all the stitches are absorbable.  Leave the steri-strips in place when changing your dressings, they will peel off with time, usually 2-3 weeks. ° °Activity:  Increase activity slowly as tolerated, but follow the weight bearing instructions below.  The rules on driving is that you can not be taking narcotics while you drive, and you must feel in control of the vehicle.   ° °Weight Bearing:   Sling at all times..   ° °To prevent constipation: you may use a stool softener such as - ° °Colace (over the counter) 100 mg by mouth twice a day  °Drink plenty of fluids (prune juice may be helpful) and high fiber foods °Miralax (over the counter) for constipation as needed.   ° °Itching:  If you experience itching with your medications, try taking only a single pain pill, or even half a pain pill at a time.  You may take up to 10 pain pills per day, and you can also use benadryl over the counter for itching or also to help with sleep.  ° °Precautions:  If you experience chest pain or shortness of breath - call 911 immediately for transfer to the hospital emergency department!! ° °If you develop a fever greater that 101 F, purulent drainage from wound, increased redness or drainage from wound, or calf pain -- Call the  office at [phone]                                                °Follow- Up Appointment:  Please call for an appointment to be seen in 2 weeks [place] - [phone] ° °Regional Anesthesia Blocks ° °1. Numbness or the inability to move the "blocked" extremity may last from 3-48 hours after placement. The length of time depends on the medication injected and your individual response to the medication. If the numbness is not going away after 48 hours, call your surgeon. ° °2. The extremity that is blocked will need to be protected until the numbness is gone and the  Strength has returned. Because you cannot feel it, you will need to take extra care to avoid injury. Because it may be weak, you may have difficulty moving it or using it. You may not know what position it is in without looking at it while the block is in effect. ° °3. For blocks in the legs and feet, returning to weight bearing and walking needs to be done carefully. You will need to wait until the numbness is entirely gone and the strength has returned. You should be   able to move your leg and foot normally before you try and bear weight or walk. You will need someone to be with you when you first try to ensure you do not fall and possibly risk injury. ° °4. Bruising and tenderness at the needle site are common side effects and will resolve in a few days. ° °5. Persistent numbness or new problems with movement should be communicated to the surgeon or the [facility] ([phone])/ [facility] ([phone]). ° °Post Anesthesia Home Care Instructions ° °Activity: °Get plenty of rest for the remainder of the day. A responsible adult should stay with you for 24 hours following the procedure.  °For the next 24 hours, DO NOT: °-Drive a car °-Operate machinery °-Drink alcoholic beverages °-Take any medication unless instructed by your physician °-Make any legal decisions or sign important papers. ° °Meals: °Start with liquid  foods such as gelatin or soup. Progress to regular foods as tolerated. Avoid greasy, spicy, heavy foods. If nausea and/or vomiting occur, drink only clear liquids until the nausea and/or vomiting subsides. Call your physician if vomiting continues. ° °Special Instructions/Symptoms: °Your throat may feel dry or sore from the anesthesia or the breathing tube placed in your throat during surgery. If this causes discomfort, gargle with warm salt water. The discomfort should disappear within 24 hours. ° °If you had a scopolamine patch placed behind your ear for the management of post- operative nausea and/or vomiting: ° °1. The medication in the patch is effective for 72 hours, after which it should be removed.  Wrap patch in a tissue and discard in the trash. Wash hands thoroughly with soap and water. °2. You may remove the patch earlier than 72 hours if you experience unpleasant side effects which may include dry mouth, dizziness or visual disturbances. °3. Avoid touching the patch. Wash your hands with soap and water after contact with the patch. °  ° ° ° ° °

## 2015-08-28 NOTE — Op Note (Signed)
08/28/2015  11:53 AM  PATIENT:  Audrey PlattMicah H Taylor    PRE-OPERATIVE DIAGNOSIS:  Right shoulder full-thickness rotator cuff tear with involvement of supraspinatus, infraspinatus, subscapularis, with biceps instability and impingement syndrome  POST-OPERATIVE DIAGNOSIS:  Same  PROCEDURE:  Right shoulder arthroscopy with extensive debridement, biceps tenolysis, acromioplasty, and rotator cuff repair of the supraspinatus infraspinatus and subscapularis.  SURGEON:  Eulas PostLANDAU,Nikyla Navedo P, MD  PHYSICIAN ASSISTANT: Janace LittenBrandon Parry, OPA-C, present and scrubbed throughout the case, critical for completion in a timely fashion, and for retraction, instrumentation, and closure.  ANESTHESIA:   General  PREOPERATIVE INDICATIONS:  Audrey PlattMicah H Taylor is a  45 y.o. female who had a massive retracted chronic rotator cuff tear elected for surgical management.  The risks benefits and alternatives were discussed with the patient preoperatively including but not limited to the risks of infection, bleeding, nerve injury, cardiopulmonary complications, the need for revision surgery, among others, and the patient was willing to proceed. We also discussed the risk that she may have recurrent rotator cuff tear, incomplete relief of symptoms, persistent weakness and pain, among others.  OPERATIVE IMPLANTS: Arthrex bio composite 4.75 mm swivel lock 3, one for the subscapularis with an inverted fiber tape, one for the infraspinatus and one for the supraspinatus. I placed a inverted fiber tape and FiberWire anteriorly with an inverted fiber tape posteriorly.  OPERATIVE FINDINGS: The patient had full motion during examination under anesthesia. Glenohumeral articular cartilage is in good condition. The biceps appeared unstable. The subscapularis had a split. The supraspinatus had a retracted bilaminar tear, the superior lamina was about 2 cm retracted, the inferior lamina was retracted to the level of the glenoid. This was still fairly mobile,  like it actually advance the superior layer to the humeral head. The tendon quality was mediocre to poor, the bone quality was excellent. There was significant subacromial spurring with actually some fragmentation of the bone underneath the acromion.  OPERATIVE PROCEDURE: The patient is brought to the operating room and placed in the supine position. Gen. anesthesia was administered. IV antibiotics were given. Examination demonstrated full motion after general anesthesia was administered. She was placed in a semilateral decubitus position and all bony prominences padded. Timeout was performed.  Diagnostic arthroscopy was carried out after a sterile prep and drape. She was in a semilateral decubitus position with 15 pounds of traction. After completion of the diagnostic arthroscopy a used the arthroscopic shaver to debride the labrum, as well as the anterior superior labrum, and the subscapularis, along with the torn leaflet from the supraspinatus. I assessed the biceps, and ultimately elected for release, given the instability pattern noted with subscapularis split.  I placed 2 anterior cannulas, used the bur to debride the bone anchorage point for the subscapularis, and then placed a simple suture through the subscapularis and anchored this into the humeral head. Excellent fixation and restoration of the subscapularis anatomy was achieved.  I then went to the subacromial space, and performed a complete bursectomy, coracoacromial ligament release, moderate acromioplasty, mild tubercleplasty, and then placed 2 lateral cannulas. I used the scorpion suture passer to place an anterior inverted fiber wire, followed by a fiber tape centrally, and then used a curved suture lasso for the posterior tissue to place an inverted fiber tape.  I then anchored the posterior tissue with a swivel lock, and had excellent fixation and advancement of the tendon, and then placed the anterior anchor as well. Complete  re-apposition of the tendon to bone was achieved, although I'm sure that  the inferior leaflet did not fully reach the lateral tuberosity, but I advanced as far as possible, hopefully get some healing medially.  The instruments were removed, the acromioplasty was touched up from posterior, and I repaired the portals with Monocryl followed by Steri-Strips and sterile gauze. She was awakened and returned to the PACU in stable and satisfactory condition. There were no complications and she tolerated the procedure well.

## 2015-08-28 NOTE — Anesthesia Preprocedure Evaluation (Signed)
Anesthesia Evaluation  Patient identified by MRN, date of birth, ID band Patient awake    Reviewed: Allergy & Precautions, NPO status , Patient's Chart, lab work & pertinent test results  History of Anesthesia Complications Negative for: history of anesthetic complications  Airway Mallampati: II  TM Distance: >3 FB Neck ROM: Full    Dental no notable dental hx. (+) Dental Advisory Given   Pulmonary neg pulmonary ROS,    Pulmonary exam normal breath sounds clear to auscultation       Cardiovascular hypertension, Normal cardiovascular exam Rhythm:Regular Rate:Normal     Neuro/Psych negative neurological ROS  negative psych ROS   GI/Hepatic negative GI ROS, Neg liver ROS,   Endo/Other  obesity  Renal/GU negative Renal ROS  negative genitourinary   Musculoskeletal negative musculoskeletal ROS (+)   Abdominal   Peds negative pediatric ROS (+)  Hematology negative hematology ROS (+)   Anesthesia Other Findings   Reproductive/Obstetrics negative OB ROS                             Anesthesia Physical Anesthesia Plan  ASA: II  Anesthesia Plan: General   Post-op Pain Management: GA combined w/ Regional for post-op pain   Induction: Intravenous  Airway Management Planned: Oral ETT  Additional Equipment:   Intra-op Plan:   Post-operative Plan: Extubation in OR  Informed Consent: I have reviewed the patients History and Physical, chart, labs and discussed the procedure including the risks, benefits and alternatives for the proposed anesthesia with the patient or authorized representative who has indicated his/her understanding and acceptance.   Dental advisory given  Plan Discussed with: CRNA  Anesthesia Plan Comments:         Anesthesia Quick Evaluation

## 2015-08-28 NOTE — Transfer of Care (Signed)
Immediate Anesthesia Transfer of Care Note  Patient: Audrey PlattMicah H Taylor  Procedure(s) Performed: Procedure(s): RIGHT SHOULDER ARTHROSCOPY WITH DEBRIDEMENT, ACROMIOPLASTY, ROTATOR CUFF REPAIR (Right) SHOULDER ACROMIOPLASTY (Right)  Patient Location: PACU  Anesthesia Type:General  Level of Consciousness: awake and sedated  Airway & Oxygen Therapy: Patient Spontanous Breathing and Patient connected to face mask oxygen  Post-op Assessment: Report given to RN and Post -op Vital signs reviewed and stable  Post vital signs: Reviewed and stable  Last Vitals:  Vitals:   08/28/15 0950 08/28/15 0951  BP: (!) 136/92   Pulse: 77 78  Resp: 18 17  Temp:      Last Pain:  Vitals:   08/28/15 0930  TempSrc: Oral         Complications: No apparent anesthesia complications

## 2015-08-28 NOTE — Anesthesia Procedure Notes (Signed)
Procedure Name: Intubation Performed by: Cleofas Hudgins W Pre-anesthesia Checklist: Patient identified, Emergency Drugs available, Suction available and Patient being monitored Patient Re-evaluated:Patient Re-evaluated prior to inductionOxygen Delivery Method: Circle system utilized Preoxygenation: Pre-oxygenation with 100% oxygen Intubation Type: IV induction Ventilation: Mask ventilation without difficulty Laryngoscope Size: Miller and 2 Grade View: Grade I Tube type: Oral Tube size: 7.0 mm Number of attempts: 1 Airway Equipment and Method: Stylet and Oral airway Placement Confirmation: ETT inserted through vocal cords under direct vision,  positive ETCO2 and breath sounds checked- equal and bilateral Secured at: 22 cm Tube secured with: Tape Dental Injury: Teeth and Oropharynx as per pre-operative assessment        

## 2015-08-28 NOTE — H&P (Signed)
PREOPERATIVE H&P  Chief Complaint: Right shoulder pain  HPI: Audrey Taylor is a 45 y.o. female who presents for preoperative history and physical with a diagnosis of Other articular cartiage disorders, right shoulder, impingement syndrome of right shoulder, Strain of muscle(s) and tendon(s) of the rotator cuff og right shoulder, inital encounter  M24.111. Symptoms are rated as moderate to severe, and have been worsening.  This is significantly impairing activities of daily living.  She has elected for surgical management. Pain is been chronic rated 6/10 at least 5 years duration. Has had injections about 3 months ago, persistent weakness and difficulty with function. Pain worse with overhead activity.  Past Medical History:  Diagnosis Date  . Hyperlipidemia   . Hypertension   . Menorrhagia   . Obesity   . Shoulder pain    Past Surgical History:  Procedure Laterality Date  . ENDOMETRIAL ABLATION  2010  . I&D EXTREMITY  05/28/2011   Procedure: IRRIGATION AND DEBRIDEMENT EXTREMITY;  Surgeon: Marlowe ShoresMatthew A Weingold, MD;  Location: [facility] OR;  Service: Orthopedics;  Laterality: Right;  . TUBAL LIGATION     Social History   Social History  . Marital status: Married    Spouse name: N/A  . Number of children: N/A  . Years of education: N/A   Social History Main Topics  . Smoking status: Never Smoker  . Smokeless tobacco: Never Used  . Alcohol use No  . Drug use: No  . Sexual activity: Yes    Birth control/ protection: Surgical   Other Topics Concern  . None   Social History Narrative  . None   Family History  Problem Relation Age of Onset  . Breast cancer Mother   . Hypertension Father    Allergies  Allergen Reactions  . Penicillins Other (See Comments)    Childhood allergy   Prior to Admission medications   Medication Sig Start Date End Date Taking? Authorizing Provider  amLODipine (NORVASC) 5 MG tablet Take 1 tablet (5 mg total) by mouth daily. Blood pressure 03/19/15  Yes  Ambrose FinlandValerie A Keck, NP  simvastatin (ZOCOR) 20 MG tablet TAKE 1 TABLET BY MOUTH AT BEDTIME FOR CHOLESTEROL 07/28/15  Yes Quentin Angstlugbemiga E Jegede, MD  acetaminophen (TYLENOL) 500 MG tablet Take 500 mg by mouth every 6 (six) hours as needed.    Historical Provider, MD     Positive ROS: All other systems have been reviewed and were otherwise negative with the exception of those mentioned in the HPI and as above.  Physical Exam: General: Alert, no acute distress Cardiovascular: No pedal edema Respiratory: No cyanosis, no use of accessory musculature GI: No organomegaly, abdomen is soft and non-tender Skin: No lesions in the area of chief complaint Neurologic: Sensation intact distally Psychiatric: Patient is competent for consent with normal mood and affect Lymphatic: No axillary or cervical lymphadenopathy  MUSCULOSKELETAL: Right shoulder active motion 0-170 of external rotation to 10 and no pain over the acromioclavicular joint. Rotator cuff strength is weak.  Assessment: Right shoulder massive rotator cuff tear and a 45 year old gentleman   Plan: Plan for Procedure(s): RIGHT SHOULDER ARTHROSCOPY WITH DEBRIDEMENT, ACROMIOPLASTY, ROTATOR CUFF REPAIR, possible biceps tear lysis, possible debridement of irreparable tear  The risks benefits and alternatives were discussed with the patient including but not limited to the risks of nonoperative treatment, versus surgical intervention including infection, bleeding, nerve injury,  blood clots, cardiopulmonary complications, morbidity, mortality, among others, and they were willing to proceed. We discussed the potential that this tear may  not be repairable, we will also discussed the risks for Popeye deformity, incomplete relief of symptoms, persistent weakness, among others.  Eulas Post, MD Cell [phone]   08/28/2015 9:27 AM

## 2015-08-28 NOTE — Anesthesia Procedure Notes (Signed)
Anesthesia Regional Block:  Interscalene brachial plexus block  Pre-Anesthetic Checklist: ,, timeout performed, Correct Patient, Correct Site, Correct Laterality, Correct Procedure, Correct Position, site marked, Risks and benefits discussed,  Surgical consent,  Pre-op evaluation,  At surgeon's request and post-op pain management  Laterality: Right  Prep: Maximum Sterile Barrier Precautions used, chloraprep       Needles:  Injection technique: Single-shot  Needle Type: Echogenic Stimulator Needle     Needle Length: 10cm 10 cm Needle Gauge: 21 G    Additional Needles:  Procedures: ultrasound guided (picture in chart) and nerve stimulator Interscalene brachial plexus block Narrative:  Injection made incrementally with aspirations every 5 mL.  Performed by: Personally   Additional Notes: Patient tolerated the procedure well without complications

## 2015-08-28 NOTE — Progress Notes (Signed)
Assisted Dr. Mary Judd with right, ultrasound guided, interscalene  block. Side rails up, monitors on throughout procedure. See vital signs in flow sheet. Tolerated Procedure well. 

## 2015-08-29 ENCOUNTER — Telehealth: Payer: Self-pay | Admitting: Internal Medicine

## 2015-08-29 ENCOUNTER — Encounter (HOSPITAL_BASED_OUTPATIENT_CLINIC_OR_DEPARTMENT_OTHER): Payer: Self-pay | Admitting: Orthopedic Surgery

## 2015-08-29 NOTE — Telephone Encounter (Signed)
Pt. Called stating that [facility] stating she has an appointment with them on 09/12/15 and that she needed to call her PCP so that she could get a referral b/c she has medicaid. Please f/u

## 2015-08-29 NOTE — Telephone Encounter (Signed)
Will forward to [place].   Arna Medici**Nora I see that the referral was sent to ortho sports so is it that the patient made the appt herself**

## 2015-08-29 NOTE — Telephone Encounter (Signed)
[facility]2015 and [facility] medicine [facility]2015 . I will call [facility] and authorize the visit  Thank you

## 2015-08-29 NOTE — Anesthesia Postprocedure Evaluation (Signed)
Anesthesia Post Note  Patient: Audrey Taylor  Procedure(s) Performed: Procedure(s) (LRB): RIGHT SHOULDER ARTHROSCOPY WITH DEBRIDEMENT, ACROMIOPLASTY, ROTATOR CUFF REPAIR (Right) SHOULDER ACROMIOPLASTY (Right)  Patient location during evaluation: PACU Anesthesia Type: General Level of consciousness: awake and alert Pain management: pain level controlled Vital Signs Assessment: post-procedure vital signs reviewed and stable Respiratory status: spontaneous breathing, nonlabored ventilation, respiratory function stable and patient connected to nasal cannula oxygen Cardiovascular status: blood pressure returned to baseline and stable Postop Assessment: no signs of nausea or vomiting Anesthetic complications: no    Last Vitals:  Vitals:   08/28/15 1315 08/28/15 1335  BP: (!) 149/92 (!) 140/96  Pulse: 73 74  Resp: (!) 21 18  Temp:  36.6 C    Last Pain:  Vitals:   08/28/15 1335  TempSrc: Oral  PainSc: 0-No pain                 Marianna Cid JENNETTE

## 2015-09-02 ENCOUNTER — Encounter: Payer: Self-pay | Admitting: Internal Medicine

## 2015-09-02 NOTE — Progress Notes (Addendum)
Pt seen by [facility] 08/25/15, seen by Dr Dion SaucierLandau. Right shoulder, possibly large cuff tear. Recd arthroscopy/debriedment at this time.     10/27/15 Fu note from Dr Dion SaucierLandau. Pt seen on 10/13/15, doing well post right cuff repair 08/28/15, now starting PT. Will scan note.

## 2015-09-03 ENCOUNTER — Other Ambulatory Visit: Payer: Self-pay | Admitting: Internal Medicine

## 2015-09-03 DIAGNOSIS — I10 Essential (primary) hypertension: Secondary | ICD-10-CM

## 2015-09-03 MED FILL — SIMVASTATIN 20 MG TABLET: 20 | 30 days supply | Qty: 30 | Fill #1

## 2015-10-02 MED FILL — SIMVASTATIN 20 MG TABLET: 20 | 30 days supply | Qty: 30 | Fill #2

## 2015-10-07 MED FILL — AMLODIPINE BESYLATE 5 MG TA: 5 | 30 days supply | Qty: 30 | Fill #0

## 2015-10-22 ENCOUNTER — Ambulatory Visit: Payer: Medicaid Other | Attending: Orthopedic Surgery

## 2015-10-22 DIAGNOSIS — M75121 Complete rotator cuff tear or rupture of right shoulder, not specified as traumatic: Secondary | ICD-10-CM

## 2015-10-22 DIAGNOSIS — M25511 Pain in right shoulder: Secondary | ICD-10-CM | POA: Insufficient documentation

## 2015-10-22 DIAGNOSIS — M25611 Stiffness of right shoulder, not elsewhere classified: Secondary | ICD-10-CM

## 2015-10-22 DIAGNOSIS — G8929 Other chronic pain: Secondary | ICD-10-CM | POA: Diagnosis present

## 2015-10-22 DIAGNOSIS — M6281 Muscle weakness (generalized): Secondary | ICD-10-CM

## 2015-10-22 NOTE — Therapy (Signed)
[facility] [address] [place], [place], [address] Phone: [phone]   Fax:  [phone]  Physical Therapy Evaluation  Patient Details  Name: Audrey Taylor MRN: [id number] Date of Birth: [birth year] Referring Provider: Dion Saucier, MD  Encounter Date: 10/22/2015      PT End of Session - 10/22/15 1114    Visit Number 1   Number of Visits 4   Date for PT Re-Evaluation 12/19/15   Authorization Type Medicaid   Authorization - Visit Number 1   Authorization - Number of Visits 4   PT Start Time 1015   PT Stop Time 1100   PT Time Calculation (min) 45 min   Activity Tolerance No increased pain;Patient tolerated treatment well   Behavior During Therapy [facility] for tasks assessed/performed      Past Medical History:  Diagnosis Date  . Complete tear of right rotator cuff 08/28/2015  . Hyperlipidemia   . Hypertension   . Menorrhagia   . Obesity   . Shoulder pain     Past Surgical History:  Procedure Laterality Date  . ENDOMETRIAL ABLATION  2010  . I&D EXTREMITY  05/28/2011   Procedure: IRRIGATION AND DEBRIDEMENT EXTREMITY;  Surgeon: Marlowe Shores, MD;  Location: [facility] OR;  Service: Orthopedics;  Laterality: Right;  . SHOULDER ACROMIOPLASTY Right 08/28/2015   Procedure: SHOULDER ACROMIOPLASTY;  Surgeon: Teryl Lucy, MD;  Location: [facility];  Service: Orthopedics;  Laterality: Right;  . SHOULDER ARTHROSCOPY WITH ROTATOR CUFF REPAIR Right 08/28/2015   Procedure: RIGHT SHOULDER ARTHROSCOPY WITH DEBRIDEMENT, ACROMIOPLASTY, ROTATOR CUFF REPAIR;  Surgeon: Teryl Lucy, MD;  Location: [facility];  Service: Orthopedics;  Laterality: Right;  . TUBAL LIGATION      There were no vitals filed for this visit.       Subjective Assessment - 10/22/15 1023    Subjective She report RT RTC repair 08/28/15.   No lifting over head.    She reports no injury prior to surgery.      Patient is accompained by: Family  member   Limitations Lifting;House hold activities  hair care, self care, lift and reach away from body   How long can you sit comfortably? NA   How long can you stand comfortably? NA   How long can you walk comfortably? NA   Patient Stated Goals She wants arm to be stable and to lift arm reach behind   Currently in Pain? No/denies  pain with movement   Pain Score 6    Pain Location Shoulder   Pain Orientation Right   Pain Descriptors / Indicators Heaviness;Sharp   Pain Type Surgical pain   Pain Onset More than a month ago   Pain Frequency Intermittent   Aggravating Factors  Moving or bumping arm   Pain Relieving Factors not move arm , rest.    Multiple Pain Sites No            [facility] PT Assessment - 10/22/15 1029      Assessment   Medical Diagnosis RT RTC repair   Referring Provider Dion Saucier, MD   Onset Date/Surgical Date 08/28/15   Hand Dominance Left   Next MD Visit 10/24/15   Prior Therapy No     Precautions   Precaution Comments No lifting over head     Restrictions   Weight Bearing Restrictions No     Balance Screen   Has the patient fallen in the past 6 months No   Has the patient  had a decrease in activity level because of a fear of falling?  No   Is the patient reluctant to leave their home because of a fear of falling?  No     Prior Function   Level of Independence Independent  uses LT hand now for activity RT arm did before   Vocation Unemployed     Cognition   Overall Cognitive Status Within Functional Limits for tasks assessed     Observation/Other Assessments   Focus on Therapeutic Outcomes (FOTO)  FOTO 50% limited     ROM / Strength   AROM / PROM / Strength AROM;Strength     AROM   AROM Assessment Site Shoulder   Right/Left Shoulder Right;Left   Right Shoulder Extension 28 Degrees   Right Shoulder Flexion 65 Degrees   Right Shoulder ABduction 48 Degrees   Right Shoulder Internal Rotation 75 Degrees  humerous at side   Right Shoulder  External Rotation 0 Degrees  humerous at side   Right Shoulder Horizontal ABduction --  NT   Right Shoulder Horizontal  ADduction --  NT   Left Shoulder Extension 50 Degrees   Left Shoulder Flexion 150 Degrees   Left Shoulder ABduction 155 Degrees   Left Shoulder Internal Rotation 49 Degrees   Left Shoulder External Rotation 90 Degrees   Left Shoulder Horizontal ABduction 12 Degrees   Left Shoulder Horizontal ADduction 95 Degrees     Strength   Overall Strength Comments LT UE WNL RT not tested  except she was able to apply pressure in all directions  withoutnpain but pressure light.      Palpation   Palpation comment tender over acromium     Ambulation/Gait   Gait Comments WNL                           PT Education - 10/22/15 1113    Education provided Yes   Education Details POC Medicaid limits , HEP   Person(s) Educated Patient;Spouse   Methods Explanation;Demonstration;Tactile cues;Verbal cues;Handout   Comprehension Returned demonstration;Verbalized understanding             PT Long Term Goals - 10/22/15 1121      PT LONG TERM GOAL #1   Title She will be independent with all HEP issued.   Time 10   Period Weeks   Status New     PT LONG TERM GOAL #2   Title She will be able to assist with self care with RT arm with  1-2 max pain   Time 10   Period Weeks   Status New     PT LONG TERM GOAL #3   Title Her ROM will improve to 130 degrees flexion and abduction to allow use of RT arm for self care.with 1-2 max  pain   Time 10   Period Weeks   Status New               Plan - 10/22/15 1115    Clinical Impression Statement [name] presents for moderate level evaluation  now post decompression with acromioplasty with  repair of full RTC (29826, 29827 CPT codes) supraspinatus and partial tear of infraspinatus and sub scap.   She is limited active an d passive ROM and by pain with movement.  Pain levels ar e well controlled at rest.   She should do well if she is careful and diligent at home wih exercises.    Rehab Potential  Good   PT Frequency --  3 visits through 12/19/15   PT Duration Other (comment)   PT Treatment/Interventions Cryotherapy;Passive range of motion;Manual techniques;Taping;Therapeutic exercise   PT Next Visit Plan REview HEP and progrees with treatment at 8-10 week in protocol unless cleared by MD to be more aggressive   PT Home Exercise Plan pendulum , scapula ROM, posture, active asssited limited ROM flex abduction , ER /IR   Consulted and Agree with Plan of Care Patient;Family member/caregiver   Family Member Consulted spouse      Patient will benefit from skilled therapeutic intervention in order to improve the following deficits and impairments:  Decreased range of motion, Pain, Decreased activity tolerance, Decreased strength, Impaired UE functional use  Visit Diagnosis: Complete tear of right rotator cuff - Plan: PT plan of care cert/re-cert  Chronic right shoulder pain - Plan: PT plan of care cert/re-cert  Muscle weakness (generalized) - Plan: PT plan of care cert/re-cert  Stiffness of right shoulder, not elsewhere classified - Plan: PT plan of care cert/re-cert     Problem List Patient Active Problem List   Diagnosis Date Noted  . Complete tear of right rotator cuff 08/28/2015  . HTN (hypertension), benign 08/13/2015  . HYPERTRIGLYCERIDEMIA 01/24/2009  . OBESITY 12/24/2008  . EXOPHTHALMOS 12/24/2008  . FRACTURED TOOTH 12/24/2008    Caprice RedChasse, Deshawn Witty M  PT 10/22/2015, 11:33 AM  [facility] [address] [place], [place], [address] Phone: [phone]   Fax:  [phone]  Name: Audrey Taylor MRN: [id number] Date of Birth: 12/18/1970

## 2015-10-22 NOTE — Patient Instructions (Signed)
From cabinet issued instruction for scapula ROM active , active assitive ROM, pendulum 2-5x/day 3-10 reps , no sharp pains, smooth and gentile and posture with pillow support for sitting and sleeping

## 2015-11-03 ENCOUNTER — Ambulatory Visit: Payer: Medicaid Other

## 2015-11-03 DIAGNOSIS — M75121 Complete rotator cuff tear or rupture of right shoulder, not specified as traumatic: Secondary | ICD-10-CM

## 2015-11-03 DIAGNOSIS — M25511 Pain in right shoulder: Secondary | ICD-10-CM

## 2015-11-03 DIAGNOSIS — M6281 Muscle weakness (generalized): Secondary | ICD-10-CM

## 2015-11-03 DIAGNOSIS — M25611 Stiffness of right shoulder, not elsewhere classified: Secondary | ICD-10-CM

## 2015-11-03 DIAGNOSIS — G8929 Other chronic pain: Secondary | ICD-10-CM

## 2015-11-03 NOTE — Therapy (Signed)
[facility] [address] [place], [place], [address] Phone: [phone]   Fax:  [phone]  Physical Therapy Treatment  Patient Details  Name: Audrey Taylor MRN: [id number] Date of Birth: 11/16/1970 Referring Provider: Landau, MD  Encounter Date: 11/03/2015      PT End of Session - 11/03/15 1017    Visit Number 2   Number of Visits 4   Date for PT Re-Evaluation 12/19/15   Authorization Type Medicaid   PT Start Time 1015   PT Stop Time 1056   PT Time Calculation (min) 41 min   Activity Tolerance Patient tolerated treatment well;No increased pain   Behavior During Therapy WFL for tasks assessed/performed      Past Medical History:  Diagnosis Date  . Complete tear of right rotator cuff 08/28/2015  . Hyperlipidemia   . Hypertension   . Menorrhagia   . Obesity   . Shoulder pain     Past Surgical History:  Procedure Laterality Date  . ENDOMETRIAL ABLATION  2010  . I&D EXTREMITY  05/28/2011   Procedure: IRRIGATION AND DEBRIDEMENT EXTREMITY;  Surgeon: Matthew A Weingold, MD;  Location: [facility] OR;  Service: Orthopedics;  Laterality: Right;  . SHOULDER ACROMIOPLASTY Right 08/28/2015   Procedure: SHOULDER ACROMIOPLASTY;  Surgeon: Joshua Landau, MD;  Location: [facility];  Service: Orthopedics;  Laterality: Right;  . SHOULDER ARTHROSCOPY WITH ROTATOR CUFF REPAIR Right 08/28/2015   Procedure: RIGHT SHOULDER ARTHROSCOPY WITH DEBRIDEMENT, ACROMIOPLASTY, ROTATOR CUFF REPAIR;  Surgeon: Joshua Landau, MD;  Location: [facility];  Service: Orthopedics;  Laterality: Right;  . TUBAL LIGATION      There were no vitals filed for this visit.      Subjective Assessment - 11/03/15 1018    Currently in Pain? Yes   Pain Score 2    Pain Location Shoulder   Pain Orientation Right   Pain Descriptors / Indicators Sharp;Heaviness   Pain Type Surgical pain   Pain Onset More than a month ago   Pain Frequency  Intermittent   Aggravating Factors  using arm   Pain Relieving Factors rest   Multiple Pain Sites No            [facility] PT Assessment - 11/03/15 0001      AROM   Right Shoulder Extension 30 Degrees   Right Shoulder Flexion 95 Degrees   Right Shoulder ABduction 86 Degrees   Right Shoulder Internal Rotation 12 Degrees  80 degrees abduction   Right Shoulder External Rotation 50 Degrees  80 degrees abduction                     [facility] Adult PT Treatment/Exercise - 11/03/15 1024      Shoulder Exercises: Standing   Other Standing Exercises wall  slides flexion x 15 , then yellow band rockwood x 8-10 reps    Other Standing Exercises isometrics 5 x 5 sec flex/abductio/ER / IR/ extension     Shoulder Exercises: Pulleys   Flexion 3 minutes     Shoulder Exercises: ROM/Strengthening   UBE (Upper Arm Bike) L 1 <MEASUREM36EWestern State Ho<MEASUREMEN3TerPremier Bone And Joint C<MEASUREMENTeEccs Acquisition Co[facility] S<MEASUREMENTerrilDevereux Childrens Behavioral Health <[id number] NTerUniversity Medical <MEASUREMEN76Terrilee1[place] Ho<MEASUREMEN60T664TerrConway Regional Rehabilitation Ho<MEASUREMEN28T194TerDetroit Receiving Hospital & Univ Health <MEASUREMEN1TerriKona Ambulatory Surgery Cent<MEASUREMEN51T91Terrilee38m BuBoston University Eye Associates Inc [facility] <MEASUREMEN4TeFoundation Surgical Hospital Of E<MEASUREMEN4TerrHuron Valley-Sinai HospitalalAlfonso Elliscises: Stretch   Wall Stretch - Flexion --  15 reps 5 sec   Other Shoulder Stretches behind back x 3 10 sec , IR stretch active with abduction 80 degrees                PT Education - 11/03/15 1058    Education provided Yes  Education Details HEP, cued to esase off  with incr pain   Person(s) Educated Patient;Spouse   Methods Explanation;Demonstration;Tactile cues;Verbal cues;Handout   Comprehension Returned demonstration;Verbalized understanding             PT Long Term Goals - 11/03/15 1101      PT LONG TERM GOAL #1   Title She will be independent with all HEP issued.   Status On-going     PT LONG TERM GOAL #2   Title She will be able to assist with self care with RT arm with  1-2 max pain   Status On-going     PT LONG TERM GOAL #3   Title Her ROM will improve to 130 degrees flexion and abduction to allow use of RT arm for self care.with 1-2 max  pain   Status On-going               Plan - 11/03/15 1059    Clinical Impression Statement Mr [name]  improved with ROM and less pain . She was able to do all  exercise without incr pain and agreed to HEP. Advance with band exercise next visit   PT Treatment/Interventions Cryotherapy;Passive range of motion;Manual techniques;Taping;Therapeutic exercise   PT Next Visit Plan REview HEP and progrees with treatment band exerciss for HEPMeasure ROm active   PT Home Exercise Plan pendulum , scapula ROM, posture, active asssited limited ROM flex abduction , ER /IR  isometrics   Consulted and Agree with Plan of Care Patient;Family member/caregiver   Family Member Consulted spouse      Patient will benefit from skilled therapeutic intervention in order to improve the following deficits and impairments:  Decreased range of motion, Pain, Decreased activity tolerance, Decreased strength, Impaired UE functional use  Visit Diagnosis: Complete tear of right rotator cuff  Chronic right shoulder pain  Muscle weakness (generalized)  Stiffness of right shoulder, not elsewhere classified     Problem List Patient Active Problem List   Diagnosis Date Noted  . Complete tear of right rotator cuff 08/28/2015  . HTN (hypertension), benign 08/13/2015  . HYPERTRIGLYCERIDEMIA 01/24/2009  . OBESITY 12/24/2008  . EXOPHTHALMOS 12/24/2008  . FRACTURED TOOTH 12/24/2008    Audrey Taylor, Audrey Taylor  PT 11/03/2015, 11:02 AM  [facility] [address] [place], [place], [address] Phone: [phone]   Fax:  [phone]  Name: Audrey Taylor MRN: [id number] Date of Birth: 03/28/1970

## 2015-11-03 NOTE — Patient Instructions (Addendum)
Strengthening: Isometric Flexion  Using wall for resistance, press right fist into ball using light pressure. Hold ____ seconds. Repeat ____ times per set. Do ____ sets per session. Do ____ sessions per day.  SHOULDER: Abduction (Isometric)  Use wall as resistance. Press arm against pillow. Keep elbow straight. Hold ___ seconds. ___ reps per set, ___ sets per day, ___ days per week  Extension (Isometric)  Place left bent elbow and back of arm against wall. Press elbow against wall. Hold ____ seconds. Repeat ____ times. Do ____ sessions per day.  Internal Rotation (Isometric)  Place palm of right fist against door frame, with elbow bent. Press fist against door frame. Hold ____ seconds. Repeat ____ times. Do ____ sessions per day.  External Rotation (Isometric)  Place back of left fist against door frame, with elbow bent. Press fist against door frame. Hold ____ seconds. Repeat ____ times. Do ____ sessions per day.  Copyright  VHI. All rights reserved.   ALL  5 reps 5 sec working up to 10 reps 10 sec                                Only force that is comfortable!!!!!..                                                                                                                                                           ALSO from cabinet IR /Behind back and wall reach 2-3x/day 3-10 reps 1-5 sec hold.

## 2015-11-07 ENCOUNTER — Other Ambulatory Visit: Payer: Self-pay | Admitting: Internal Medicine

## 2015-11-07 MED FILL — SIMVASTATIN 20 MG TABLET: 20 | 30 days supply | Qty: 30 | Fill #0

## 2015-11-07 MED FILL — AMLODIPINE BESYLATE 5 MG TA: 5 | 30 days supply | Qty: 30 | Fill #1

## 2015-11-17 ENCOUNTER — Ambulatory Visit: Payer: Medicaid Other | Attending: Internal Medicine | Admitting: Internal Medicine

## 2015-11-17 VITALS — BP 130/92 | HR 72 | Temp 98.4°F | Resp 16 | Wt 217.4 lb

## 2015-11-17 DIAGNOSIS — E785 Hyperlipidemia, unspecified: Secondary | ICD-10-CM | POA: Diagnosis not present

## 2015-11-17 DIAGNOSIS — Z880 Allergy status to penicillin: Secondary | ICD-10-CM | POA: Diagnosis not present

## 2015-11-17 DIAGNOSIS — I10 Essential (primary) hypertension: Secondary | ICD-10-CM | POA: Insufficient documentation

## 2015-11-17 DIAGNOSIS — Z23 Encounter for immunization: Secondary | ICD-10-CM | POA: Insufficient documentation

## 2015-11-17 DIAGNOSIS — E669 Obesity, unspecified: Secondary | ICD-10-CM | POA: Diagnosis not present

## 2015-11-17 DIAGNOSIS — R7303 Prediabetes: Secondary | ICD-10-CM | POA: Insufficient documentation

## 2015-11-17 DIAGNOSIS — Z6834 Body mass index (BMI) 34.0-34.9, adult: Secondary | ICD-10-CM | POA: Insufficient documentation

## 2015-11-17 MED ORDER — AMLODIPINE BESYLATE 5 MG PO TABS: 5.0000 mg | ORAL_TABLET | Freq: Every day | ORAL | 3 refills | Status: DC

## 2015-11-17 NOTE — Progress Notes (Signed)
Audrey Taylor, is a 45 y.o. female  ONG:[id number]  [id number]  DOB - 01/18/1970  Chief Complaint  Patient presents with  . Hypertension        Subjective:   Audrey Taylor is a 45 y.o. female here today for a follow up visit of htn and predm. Pt sp arthroscopy/debriedment pf right shoulder by ortho in   08/28/15, seen by Dr Dion SaucierLandau.  Doing very well since, and getting more mobility of shoulder.  Now starting PT.  Taking all meds as prescribed w/o difficulty. No weight loss though (loss 1 lbs since I last saw her on 8/17), and trying harder to watch her diet.   Patient has No headache, No chest pain, No abdominal pain - No Nausea, No new weakness tingling or numbness, No Cough - SOB.  Problem  Obesity   Qualifier: Diagnosis of  By: Georgiana ShoreMayans  MD, Vernona RiegerLaura       ALLERGIES: Allergies  Allergen Reactions  . Penicillins Other (See Comments)    Childhood allergy    PAST MEDICAL HISTORY: Past Medical History:  Diagnosis Date  . Complete tear of right rotator cuff 08/28/2015  . Hyperlipidemia   . Hypertension   . Menorrhagia   . Obesity   . Shoulder pain     MEDICATIONS AT HOME: Prior to Admission medications   Medication Sig Start Date End Date Taking? Authorizing Provider  amLODipine (NORVASC) 5 MG tablet TAKE ONE TABLET BY MOUTH ONCE DAILY FOR BLOOD PRESSURE 09/03/15  Yes Pete Glatterawn T Langeland, MD  simvastatin (ZOCOR) 20 MG tablet TAKE 1 TABLET BY MOUTH AT BEDTIME FOR CHOLESTEROL 11/07/15  Yes Pete Glatterawn T Langeland, MD  baclofen (LIORESAL) 10 MG tablet Take 1 tablet (10 mg total) by mouth 3 (three) times daily. As needed for muscle spasm Patient not taking: Reported on 10/22/2015 08/28/15   Teryl LucyJoshua Landau, MD  ondansetron (ZOFRAN) 4 MG tablet Take 1 tablet (4 mg total) by mouth every 8 (eight) hours as needed for nausea or vomiting. Patient not taking: Reported on 10/22/2015 08/28/15   Teryl LucyJoshua Landau, MD  oxyCODONE-acetaminophen (PERCOCET) 10-325 MG tablet Take 1-2 tablets by mouth every  6 (six) hours as needed for pain. MAXIMUM TOTAL ACETAMINOPHEN DOSE IS 4000 MG PER DAY Patient not taking: Reported on 10/22/2015 08/28/15   Teryl LucyJoshua Landau, MD  sennosides-docusate sodium (SENOKOT-S) 8.6-50 MG tablet Take 2 tablets by mouth daily. Patient not taking: Reported on 10/22/2015 08/28/15   Teryl LucyJoshua Landau, MD     Objective:   Vitals:   11/17/15 1524  BP: (!) 130/92  Pulse: 72  Resp: 16  Temp: 98.4 F (36.9 C)  TempSrc: Oral  SpO2: 98%  Weight: 217 lb 6.4 oz (98.6 kg)    Exam General appearance : Awake, alert, not in any distress. Speech Clear. Not toxic looking, morbid obese, pleasant. HEENT: Atraumatic and Normocephalic, pupils equally reactive to light. Neck: supple, no JVD.  Chest:Good air entry bilaterally, no added sounds. CVS: S1 S2 regular, no murmurs/gallups or rubs. Abdomen: Bowel sounds active, obese, Non tender and not distended with no gaurding, rigidity or rebound. Extremities: B/L Lower Ext shows no edema, both legs are warm to touch, rom of right shoulder is good, still some limitations (but better than prior per pt.) Neurology: Awake alert, and oriented X 3, CN II-XII grossly intact, Non focal Skin:No Rash  Data Review Lab Results  Component Value Date   HGBA1C 5.6 08/13/2015   HGBA1C 5.9 05/29/2013    Depression screen [facility] 2/9 11/17/2015 08/13/2015  08/07/2015 06/10/2015 04/01/2015  Decreased Interest 0 0 0 0 0  Down, Depressed, Hopeless 0 0 0 0 0  PHQ - 2 Score 0 0 0 0 0      Assessment & Plan   1. HTN (hypertension), benign Well controlled, dash diet discussed, continue norvasc 5 qd  2. Obesity without serious comorbidity, unspecified classification, unspecified obesity type Low carb diet discussed extensively, lc/hf/if diet info provided   3. Encounter for immunization - Flu Vaccine QUAD 36+ mos IM  4. hld Renewed simvastatin,     Patient have been counseled extensively about nutrition and exercise  Return in about 3 months (around  02/17/2016).  The patient was given clear instructions to go to ER or return to medical center if symptoms don't improve, worsen or new problems develop. The patient verbalized understanding. The patient was told to call to get lab results if they haven't heard anything in the next week.   This note has been created with Education officer, environmentalDragon speech recognition software and smart phrase technology. Any transcriptional errors are unintentional.   Pete Glatterawn T Langeland, MD, MBA/MHA [facility] [place], [place] [phone]   11/17/2015, 5:22 PM

## 2015-11-17 NOTE — Patient Instructions (Addendum)
QUICK START PATIENT GUIDE TO LCHF/IF LOW CARB HIGH FAT / INTERMITTENT FASTING  Recommend: <50 gram carbohydrate a day for weightloss.  What is this diet and how does it work? o Insulin is a hormone made by your body that allows you to use sugar (glucose) from carbohydrates in the food you eat for energy or to store glucose (as fat) for future use  o Insulin levels need to be lowered in order to utilize our stored energy (fat) o Many struggling with obesity are insulin resistant and have high levels of insulin o This diet works to lower your insulin in two ways o Fasting - allows your insulin levels to naturally decrease  o Avoiding carbohydrates - carbs trigger increase in insulin Low Carb Healthy Fat (LCHF) o Get a free app for your phone, such as MyFitnessPal, to help you track your macronutrients (carbs/protein/fats) and to track your weight and body measurements to see your progress o Set your goal for around 10% carbs/20% protein/70% fat o A good starting goal for amount of net carbs per day is 50 grams (some will aim for 20 grams) o "Net carbs" refers to total grams of carbs minus grams of fiber (as fiber is not typically absorbed). For example, if a food has 5g total carb and 3g fiber, that would be 2g net carbs o Increase healthy fats - eg. olive oil, eggs, nuts, avocado, cheese, butter, coconut, meats, fish o Avoid high carb foods - eg. bread, pasta, potatoes, rice, cookies, soda, juice, anything sugary o Buy full-fat ingredients (avoid low-fat versions, which often have more sugar) o No need to count calories, but pay close attention to grams of carbs on labels Intermittent Fasting (IF) o "Fasting" is going a period of time without eating - it helps to stay busy and well-hydrated o Purpose of fasting is to allow insulin levels to drop as low as possible, allowing your body to switch into fat-burning mode o With this diet there are many approaches to fasting, but 16:8 and 24hr fasts  are commonly used o 16:8 fast, usually 5-7 days a week - Fasting for 16 hours of the day, then eating all meals for the day over course of 8 hours. o 24 hour fast, usually 1-3 days a week - Typically eating one meal a day, then fasting until the next day. Plenty of fluids (and some salt to help you hold onto fluids) are recommended during longer fasts.  o During fasts certain beverages are still acceptable - water, sparkling water, bone broth, black tea or coffee, or tea/coffee with small amount of heavy whipping cream Special note for those on diabetic medications o Discuss your medications with your physician. You may need to hold your medication or adjust to only taking when eating. Diabetics should keep close track of their blood sugars when making any changes to diet/meds, to ensure they are staying within normal limits For more info about LCHF/IF o Watch "Therapeutic Fasting - Solving the Two-Compartment Problem" video by Dr. Jason Fung on YouTube (https://www.youtube.com/watch?v=tIuj-oMN-Fk) for a great intro to these concepts o Read "The Obesity Code" and/or "The Complete Guide to Fasting" by Dr. Jason Fung o Go to www.dietdoctor.com for explanations, recipes, and infographics about foods to eat/avoid o Get a Free smartphone app that helps count carbohydrates  - ie MyKeto EXAMPLES TO GET STARTED Fasting Beverages -water (can add  tsp Pink Himalayan salt once or twice a day to help stay hydrated for longer fasts) -Sparkling water (such as   [employer] or similar; avoid any with artificial sweeteners)  -Bone broth (multiple recipes available online or can buy pre-made) -Tea or Coffee (Adding heavy whipping cream or coconut oil to your tea or coffee can be helpful if you find yourself getting too hungry during the fasts. Can also add cinnamon for flavor. Or "bulletproof coffee.") Low Carb Healthy Fat Breakfast (if not fasting) -eggs in butter or olive oil with avocado -omelet with veggies and  cheese  Lunch -hamburger with cheese and avocado wrapped in lettuce (no bun, no ketchup) -meat and cheese wrapped in lettuce (can dip in mustard or olive oil/vinegar/mayo) -salad with meat/cheese/nuts and higher fat dressing (vinaigrette or Ranch, etc) -tuna salad lettuce wrap -taco meat with cheese, sour cream, guacamole, cheese over lettuce  Dinner -steak with herb butter or Barnaise sauce -"Fathead" pizza (uses cheese and almond flour for the dough - several recipes available online) -roasted or grilled chicken with skin on, with low carb sauce (buffalo, garlic butter, alfredo, pesto, etc) -baked salmon with lemon butter -chicken alfredo with zucchini noodles -[place] butter chicken with low carb garlic naan -egg roll in a bowl  Side Dishes -mashed cauliflower (homemade or available in freezer section) -roast vegetables (green veggies that grow above ground rather than root veggies) with butter or cheese -Caprese salad (fresh mozzarella, tomato and basil with olive oil) -homemade low-carb coleslaw Snacks/Desserts (try to avoid unnecessary snacking and sweets in general) -celery or cucumber dipped in guacamole or sour cream dip -cheese and meat slices  -raspberries with whipped cream (can make homemade with no sugar added) -low carb [place] butter cake  AVOID - sugar, diet/regular soda, potatoes, breads, rice, pasta, candy, cookies, cakes, muffins, juice, high carb fruit (bananas, grapes), beer, ketchup, barbeque and other sweet saucesInfluenza Virus Vaccine injection (Fluarix) What is this medicine? INFLUENZA VIRUS VACCINE (in floo EN zuh VAHY ruhs vak SEEN) helps to reduce the risk of getting influenza also known as the flu. This medicine may be used for other purposes; ask your health care provider or pharmacist if you have questions. What should I tell my health care provider before I take this medicine? They need to know if you have any of these conditions: -bleeding  disorder like hemophilia -fever or infection -Guillain-Barre syndrome or other neurological problems -immune system problems -infection with the human immunodeficiency virus (HIV) or AIDS -low blood platelet counts -multiple sclerosis -an unusual or allergic reaction to influenza virus vaccine, eggs, chicken proteins, latex, gentamicin, other medicines, foods, dyes or preservatives -pregnant or trying to get pregnant -breast-feeding How should I use this medicine? This vaccine is for injection into a muscle. It is given by a health care professional. A copy of Vaccine Information Statements will be given before each vaccination. Read this sheet carefully each time. The sheet may change frequently. Talk to your pediatrician regarding the use of this medicine in children. Special care may be needed. Overdosage: If you think you have taken too much of this medicine contact a poison control center or emergency room at once. NOTE: This medicine is only for you. Do not share this medicine with others. What if I miss a dose? This does not apply. What may interact with this medicine? -chemotherapy or radiation therapy -medicines that lower your immune system like etanercept, anakinra, infliximab, and adalimumab -medicines that treat or prevent blood clots like warfarin -phenytoin -steroid medicines like prednisone or cortisone -theophylline -vaccines This list may not describe all possible interactions. Give your health care provider a list  of all the medicines, herbs, non-prescription drugs, or dietary supplements you use. Also tell them if you smoke, drink alcohol, or use illegal drugs. Some items may interact with your medicine. What should I watch for while using this medicine? Report any side effects that do not go away within 3 days to your doctor or health care professional. Call your health care provider if any unusual symptoms occur within 6 weeks of receiving this vaccine. You may  still catch the flu, but the illness is not usually as bad. You cannot get the flu from the vaccine. The vaccine will not protect against colds or other illnesses that may cause fever. The vaccine is needed every year. What side effects may I notice from receiving this medicine? Side effects that you should report to your doctor or health care professional as soon as possible: -allergic reactions like skin rash, itching or hives, swelling of the face, lips, or tongue Side effects that usually do not require medical attention (report to your doctor or health care professional if they continue or are bothersome): -fever -headache -muscle aches and pains -pain, tenderness, redness, or swelling at site where injected -weak or tired This list may not describe all possible side effects. Call your doctor for medical advice about side effects. You may report side effects to FDA at 1-800-FDA-1088. Where should I keep my medicine? This vaccine is only given in a clinic, pharmacy, doctor's office, or other health care setting and will not be stored at home. NOTE: This sheet is a summary. It may not cover all possible information. If you have questions about this medicine, talk to your doctor, pharmacist, or health care provider.    2016, Elsevier/Gold Standard. (2007-07-19 09:30:40)

## 2015-11-17 NOTE — Progress Notes (Signed)
Pt is in the office today for hypertension Pt states she is not in any pain Pt states she is taking medications without difficulty

## 2015-11-19 ENCOUNTER — Other Ambulatory Visit: Payer: Self-pay | Admitting: Internal Medicine

## 2015-11-19 DIAGNOSIS — Z1231 Encounter for screening mammogram for malignant neoplasm of breast: Secondary | ICD-10-CM

## 2015-11-24 ENCOUNTER — Ambulatory Visit: Payer: Medicaid Other | Attending: Orthopedic Surgery

## 2015-11-24 DIAGNOSIS — G8929 Other chronic pain: Secondary | ICD-10-CM | POA: Diagnosis present

## 2015-11-24 DIAGNOSIS — M75121 Complete rotator cuff tear or rupture of right shoulder, not specified as traumatic: Secondary | ICD-10-CM | POA: Diagnosis not present

## 2015-11-24 DIAGNOSIS — M6281 Muscle weakness (generalized): Secondary | ICD-10-CM | POA: Diagnosis present

## 2015-11-24 DIAGNOSIS — M25511 Pain in right shoulder: Secondary | ICD-10-CM | POA: Insufficient documentation

## 2015-11-24 DIAGNOSIS — M25611 Stiffness of right shoulder, not elsewhere classified: Secondary | ICD-10-CM | POA: Diagnosis present

## 2015-11-24 NOTE — Therapy (Signed)
[place], [place], [address] Phone: [phone]   Fax:  [phone]  Physical Therapy Treatment  Patient Details  Name: Audrey Taylor MRN: [id number] Date of Birth: 04/29/1970 Referring Provider: Mardelle Matte, MD  Encounter Date: 11/24/2015      PT End of Session - 11/24/15 1059    Visit Number 3   Number of Visits 4   Date for PT Re-Evaluation 12/19/15   Authorization Type Medicaid   Authorization - Visit Number 3   Authorization - Number of Visits 4   PT Start Time 2725   PT Stop Time 1100   PT Time Calculation (min) 45 min   Activity Tolerance Patient tolerated treatment well   Behavior During Therapy [facility] for tasks assessed/performed      Past Medical History:  Diagnosis Date  . Complete tear of right rotator cuff 08/28/2015  . Hyperlipidemia   . Hypertension   . Menorrhagia   . Obesity   . Shoulder pain     Past Surgical History:  Procedure Laterality Date  . ENDOMETRIAL ABLATION  2010  . I&D EXTREMITY  05/28/2011   Procedure: IRRIGATION AND DEBRIDEMENT EXTREMITY;  Surgeon: Schuyler Amor, MD;  Location: [place];  Service: Orthopedics;  Laterality: Right;  . SHOULDER ACROMIOPLASTY Right 08/28/2015   Procedure: SHOULDER ACROMIOPLASTY;  Surgeon: Marchia Bond, MD;  Location: [place];  Service: Orthopedics;  Laterality: Right;  . SHOULDER ARTHROSCOPY WITH ROTATOR CUFF REPAIR Right 08/28/2015   Procedure: RIGHT SHOULDER ARTHROSCOPY WITH DEBRIDEMENT, ACROMIOPLASTY, ROTATOR CUFF REPAIR;  Surgeon: Marchia Bond, MD;  Location: [place];  Service: Orthopedics;  Laterality: Right;  . TUBAL LIGATION      There were no vitals filed for this visit.      Subjective Assessment - 11/24/15 1018    Subjective Arm is doing pretty well with twinges of pain some days better than others but coming along. MD said no lifting weight overhead , lift to waist level only   Currently in Pain? No/denies            [facility] PT Assessment - 11/24/15 0001      AROM   Right Shoulder Flexion 108 Degrees   Right Shoulder ABduction 90 Degrees   Right Shoulder Internal Rotation 12 Degrees   Right Shoulder External Rotation 48 Degrees                     [facility] Adult PT Treatment/Exercise - 11/24/15 0946      Shoulder Exercises: Standing   Other Standing Exercises wall  slides flexion x 10 , then red band rockwood x 8-112 reps      Shoulder Exercises: Pulleys   Flexion 3 minutes     Shoulder Exercises: ROM/Strengthening   UBE (Upper Arm Bike) L 1 96mn     Shoulder Exercises: Stretch   Other Shoulder Stretches behind back x 3 10 sec , IR stretch active with abduction 80 degrees   Other Shoulder Stretches supine cane exercises for ROM     Manual Therapy   Manual Therapy Passive ROM;Joint mobilization   Joint Mobilization Gr 3 inf glides  with osscilations   Passive ROM Rotation IIR/ER flexion / hor abduction 10-15 sec holds x 10 reps each.                 PT Education - 11/24/15 1104    Education provided Yes   Education Details rockwood, supine cane  exercises, asked to stop woith incr pain or sharp pains   Person(s) Educated Patient   Methods Explanation;Tactile cues;Verbal cues;Handout   Comprehension Returned demonstration;Verbalized understanding             PT Long Term Goals - 11/24/15 1018      PT LONG TERM GOAL #1   Title She will be independent with all HEP issued.   Status On-going     PT LONG TERM GOAL #2   Title She will be able to assist with self care with RT arm with  1-2 max pain   Baseline Doing more with the RT arm   Status Partially Met     PT LONG TERM GOAL #3   Title Her ROM will improve to 130 degrees flexion and abduction to allow use of RT arm for self care.with 1-2 max  pain               Plan - 11/24/15 0946    Clinical Impression Statement 10 weeks out from surgery .   no  increased pain post session and was able to do HEP correctly. She was cautioned to stop with pain and to progress cautiously.  Nest visit in 3 weeks.    PT Treatment/Interventions Cryotherapy;Passive range of motion;Manual techniques;Taping;Therapeutic exercise   PT Next Visit Plan REview HEP and progresss HEP  Measure ROM active   PT Home Exercise Plan pendulum , scapula ROM, posture, active asssited limited ROM flex abduction , ER /IR  isometrics, rockwood band yellow/redupine canr for ROM   Consulted and Agree with Plan of Care Patient   Family Member Consulted spouse      Patient will benefit from skilled therapeutic intervention in order to improve the following deficits and impairments:  Decreased range of motion, Pain, Decreased activity tolerance, Decreased strength, Impaired UE functional use  Visit Diagnosis: Complete tear of right rotator cuff  Chronic right shoulder pain  Muscle weakness (generalized)  Stiffness of right shoulder, not elsewhere classified     Problem List Patient Active Problem List   Diagnosis Date Noted  . Complete tear of right rotator cuff 08/28/2015  . HTN (hypertension), benign 08/13/2015  . HYPERTRIGLYCERIDEMIA 01/24/2009  . Obesity 12/24/2008  . EXOPHTHALMOS 12/24/2008  . FRACTURED TOOTH 12/24/2008    Audrey Taylor  PT 11/24/2015, 11:05 AM  [facility] [address] [place], [place], [address] Phone: [phone]   Fax:  [phone]  Name: Audrey Taylor MRN: [id number] Date of Birth: [birth year]

## 2015-11-24 NOTE — Patient Instructions (Signed)
From cabinet supine cane 10-15 reps and rockwood with red/yellow band  1-2x/day 10-15 reps

## 2015-12-11 ENCOUNTER — Ambulatory Visit
Admission: RE | Admit: 2015-12-11 | Discharge: 2015-12-11 | Disposition: A | Discharge status: Home / Self Care | Payer: Medicaid Other | Source: Ambulatory Visit | Attending: Internal Medicine | Admitting: Internal Medicine

## 2015-12-11 DIAGNOSIS — Z1231 Encounter for screening mammogram for malignant neoplasm of breast: Secondary | ICD-10-CM

## 2015-12-15 ENCOUNTER — Telehealth: Payer: Self-pay

## 2015-12-15 ENCOUNTER — Ambulatory Visit: Payer: Medicaid Other | Attending: Orthopedic Surgery

## 2015-12-15 DIAGNOSIS — M6281 Muscle weakness (generalized): Secondary | ICD-10-CM | POA: Diagnosis present

## 2015-12-15 DIAGNOSIS — M25511 Pain in right shoulder: Secondary | ICD-10-CM | POA: Insufficient documentation

## 2015-12-15 DIAGNOSIS — G8929 Other chronic pain: Secondary | ICD-10-CM | POA: Diagnosis present

## 2015-12-15 DIAGNOSIS — M75121 Complete rotator cuff tear or rupture of right shoulder, not specified as traumatic: Secondary | ICD-10-CM | POA: Diagnosis not present

## 2015-12-15 DIAGNOSIS — M25611 Stiffness of right shoulder, not elsewhere classified: Secondary | ICD-10-CM | POA: Diagnosis present

## 2015-12-15 NOTE — Therapy (Signed)
[place], [place], [address] Phone: [phone]   Fax:  [phone]  Physical Therapy Treatment/Discharge  Patient Details  Name: Audrey Taylor MRN: [id number] Date of Birth: 05/21/1970 Referring Provider: Mardelle Matte, MD  Encounter Date: 12/15/2015      PT End of Session - 12/15/15 0937    Visit Number 4   Number of Visits 4   Date for PT Re-Evaluation 12/19/15   Authorization Type Medicaid   Authorization - Visit Number 4   Authorization - Number of Visits 4   PT Start Time 0933   PT Stop Time 1015   PT Time Calculation (min) 42 min   Activity Tolerance Patient tolerated treatment well   Behavior During Therapy [facility] for tasks assessed/performed      Past Medical History:  Diagnosis Date  . Complete tear of right rotator cuff 08/28/2015  . Hyperlipidemia   . Hypertension   . Menorrhagia   . Obesity   . Shoulder pain     Past Surgical History:  Procedure Laterality Date  . ENDOMETRIAL ABLATION  2010  . I&D EXTREMITY  05/28/2011   Procedure: IRRIGATION AND DEBRIDEMENT EXTREMITY;  Surgeon: Schuyler Amor, MD;  Location: [place];  Service: Orthopedics;  Laterality: Right;  . SHOULDER ACROMIOPLASTY Right 08/28/2015   Procedure: SHOULDER ACROMIOPLASTY;  Surgeon: Marchia Bond, MD;  Location: [place];  Service: Orthopedics;  Laterality: Right;  . SHOULDER ARTHROSCOPY WITH ROTATOR CUFF REPAIR Right 08/28/2015   Procedure: RIGHT SHOULDER ARTHROSCOPY WITH DEBRIDEMENT, ACROMIOPLASTY, ROTATOR CUFF REPAIR;  Surgeon: Marchia Bond, MD;  Location: [place];  Service: Orthopedics;  Laterality: Right;  . TUBAL LIGATION      There were no vitals filed for this visit.      Subjective Assessment - 12/15/15 0940    Subjective Alot of pain since monday may be from cold weather pain from scapula to elbow.  She has been taking asprin and muscle rub. No heat pad so does not use heat.     Patient is accompained by: Family member   Currently in Pain? Yes   Pain Score 8    Pain Location Shoulder  upper back and elbow.    Pain Orientation Right   Pain Descriptors / Indicators Throbbing;Sharp;Aching   Pain Type Acute pain   Pain Onset In the past 7 days   Pain Frequency Constant   Aggravating Factors  may be from cold   Pain Relieving Factors meds rest.    Multiple Pain Sites No            [facility] PT Assessment - 12/15/15 0001      AROM   Right Shoulder Extension 58 Degrees   Right Shoulder Flexion 115 Degrees   Right Shoulder ABduction 130 Degrees   Right Shoulder Internal Rotation 30 Degrees   Right Shoulder External Rotation 60 Degrees   Right Shoulder Horizontal ABduction 15 Degrees   Right Shoulder Horizontal  ADduction 95 Degrees     Strength   Overall Strength Within functional limits for tasks performed                     [facility] Adult PT Treatment/Exercise - 12/15/15 0001      Shoulder Exercises: Standing   Other Standing Exercises wall  slides flexion x 10 , then green band rockwood x 15 reps      Shoulder Exercises: Pulleys   Flexion 3 minutes  Shoulder Exercises: Stretch   Other Shoulder Stretches behind back x 3 10 sec , ER stretch active with abduction 90 degrees in doorway 10-15 sec x 5  isssued handout      Manual Therapy   Passive ROM Rotation IIR/ER flexion / hor abduction 10-15 sec holds x 10 reps each.                 PT Education - 12/15/15 1010    Education provided Yes   Education Details stretch IR ER   Person(s) Educated Patient   Methods Explanation;Demonstration;Tactile cues;Verbal cues;Handout   Comprehension Returned demonstration;Verbalized understanding             PT Long Term Goals - 12/15/15 0939      PT LONG TERM GOAL #1   Title She will be independent with all HEP issued.   Status Achieved     PT LONG TERM GOAL #2   Title She will be able to assist with self care with RT arm  with  1-2 max pain   Status Partially Met     PT LONG TERM GOAL #3   Title Her ROM will improve to 130 degrees flexion and abduction to allow use of RT arm for self care.with 1-2 max  pain   Status Partially Met               Plan - 12/15/15 0937    Clinical Impression Statement Improveing . She has a HEP she is able to do all correctly. She is not allowed to lift overhead per her report so will keep resisted exercise below shoulder level. Pain worse now but did not get worse afdter exercise so i advised her to continue exercises if pain does not incr or sub sidedes quickly after initiating exer,    PT Treatment/Interventions Cryotherapy;Passive range of motion;Manual techniques;Taping;Therapeutic exercise   PT Next Visit Plan Discharge with HEP   PT Home Exercise Plan pendulum , scapula ROM, posture, active asssited limited ROM flex abduction , ER /IR  isometrics, rockwood band yellow/red supine cane for ROM, IR ER stretch   Consulted and Agree with Plan of Care Patient;Family member/caregiver   Family Member Consulted spouse      Patient will benefit from skilled therapeutic intervention in order to improve the following deficits and impairments:  Decreased range of motion, Pain, Decreased activity tolerance, Decreased strength, Impaired UE functional use  Visit Diagnosis: Complete tear of right rotator cuff  Chronic right shoulder pain  Muscle weakness (generalized)  Stiffness of right shoulder, not elsewhere classified     Problem List Patient Active Problem List   Diagnosis Date Noted  . Complete tear of right rotator cuff 08/28/2015  . HTN (hypertension), benign 08/13/2015  . HYPERTRIGLYCERIDEMIA 01/24/2009  . Obesity 12/24/2008  . EXOPHTHALMOS 12/24/2008  . FRACTURED TOOTH 12/24/2008    Darrel Hoover PT 12/15/2015, 10:13 AM  [facility] [address] [place], [place], [address] Phone: [phone]    Fax:  [phone]  Name: Audrey Taylor MRN: [id number] Date of Birth: 1970/10/14   PHYSICAL THERAPY DISCHARGE SUMMARY  Visits from Start of Care: 4  Current functional level related to goals / functional outcomes: See above , ROM still limited   Remaining deficits: See above   Education / Equipment: HEP Plan: Patient agrees to discharge.  Patient goals were not met. Patient is being discharged due to financial reasons.  ?????    Lillette Boxer Delanda Bulluck  PT  12/15/15     10:15 AM

## 2015-12-15 NOTE — Telephone Encounter (Signed)
Contacted pt to go over mm results pt didn't answer was unable to lvm

## 2015-12-15 NOTE — Patient Instructions (Signed)
From cabinet  Issued handout for behind back and ER in doorway 10-30 sec 2-15 reps 2x/day

## 2016-01-01 MED FILL — SIMVASTATIN 20 MG TABLET: 20 | 30 days supply | Qty: 30 | Fill #1

## 2016-01-01 MED FILL — AMLODIPINE BESYLATE 5 MG TA: 5 | 30 days supply | Qty: 30 | Fill #2

## 2016-01-14 ENCOUNTER — Encounter: Payer: Self-pay | Admitting: Internal Medicine

## 2016-01-14 NOTE — Progress Notes (Signed)
Pt seen by DR Teryl LucyJoshua Landau ortho 12/22/15 Audrey Taylor ortho  Sp arthroscopic rotator cuff repair right shoulder due to cuff tear. - now seen on f/u - pt needs to wk on home exercises - rx voltaren gel and baclofen given. - fu in 2 months  Will scan notes

## 2016-02-09 ENCOUNTER — Other Ambulatory Visit: Payer: Self-pay | Admitting: Internal Medicine

## 2016-02-09 DIAGNOSIS — I10 Essential (primary) hypertension: Secondary | ICD-10-CM

## 2016-02-25 ENCOUNTER — Other Ambulatory Visit: Payer: Self-pay | Admitting: Internal Medicine

## 2016-02-25 DIAGNOSIS — I10 Essential (primary) hypertension: Secondary | ICD-10-CM

## 2016-02-25 MED FILL — VOLTAREN 1% GEL: 1 | 30 days supply | Qty: 200 | Fill #0

## 2016-02-25 MED FILL — ?SIMVASTATIN 20 MG TABLET: 20 MG | 30 days supply | Qty: 30 | Fill #2

## 2016-03-05 ENCOUNTER — Other Ambulatory Visit: Payer: Self-pay | Admitting: Internal Medicine

## 2016-03-05 DIAGNOSIS — I10 Essential (primary) hypertension: Secondary | ICD-10-CM

## 2016-04-09 ENCOUNTER — Encounter (HOSPITAL_COMMUNITY): Payer: Self-pay | Admitting: *Deleted

## 2016-04-18 ENCOUNTER — Emergency Department (HOSPITAL_COMMUNITY): Payer: Medicaid Other

## 2016-04-18 ENCOUNTER — Encounter (HOSPITAL_COMMUNITY): Payer: Self-pay | Admitting: *Deleted

## 2016-04-18 ENCOUNTER — Emergency Department (HOSPITAL_COMMUNITY)
Admission: EM | Admit: 2016-04-18 | Discharge: 2016-04-19 | Disposition: A | Discharge status: Home / Self Care | Payer: Medicaid Other | Attending: Emergency Medicine | Admitting: Emergency Medicine

## 2016-04-18 DIAGNOSIS — S0181XA Laceration without foreign body of other part of head, initial encounter: Secondary | ICD-10-CM | POA: Insufficient documentation

## 2016-04-18 DIAGNOSIS — R791 Abnormal coagulation profile: Secondary | ICD-10-CM | POA: Insufficient documentation

## 2016-04-18 DIAGNOSIS — Y999 Unspecified external cause status: Secondary | ICD-10-CM | POA: Diagnosis not present

## 2016-04-18 DIAGNOSIS — S80812A Abrasion, left lower leg, initial encounter: Secondary | ICD-10-CM | POA: Diagnosis not present

## 2016-04-18 DIAGNOSIS — Y9389 Activity, other specified: Secondary | ICD-10-CM | POA: Diagnosis not present

## 2016-04-18 DIAGNOSIS — T1490XA Injury, unspecified, initial encounter: Secondary | ICD-10-CM

## 2016-04-18 DIAGNOSIS — Y92009 Unspecified place in unspecified non-institutional (private) residence as the place of occurrence of the external cause: Secondary | ICD-10-CM | POA: Diagnosis not present

## 2016-04-18 DIAGNOSIS — X371XXA Tornado, initial encounter: Secondary | ICD-10-CM | POA: Insufficient documentation

## 2016-04-18 DIAGNOSIS — Z23 Encounter for immunization: Secondary | ICD-10-CM | POA: Insufficient documentation

## 2016-04-18 DIAGNOSIS — S81011A Laceration without foreign body, right knee, initial encounter: Secondary | ICD-10-CM | POA: Diagnosis not present

## 2016-04-18 DIAGNOSIS — I10 Essential (primary) hypertension: Secondary | ICD-10-CM | POA: Diagnosis not present

## 2016-04-18 DIAGNOSIS — Z79899 Other long term (current) drug therapy: Secondary | ICD-10-CM | POA: Diagnosis not present

## 2016-04-18 DIAGNOSIS — S0990XA Unspecified injury of head, initial encounter: Secondary | ICD-10-CM | POA: Diagnosis present

## 2016-04-18 HISTORY — DX: Pure hypercholesterolemia, unspecified: E78.00

## 2016-04-18 LAB — COMPREHENSIVE METABOLIC PANEL
ALBUMIN: 4 g/dL (ref 3.5–5.0)
ALT: 35 U/L (ref 14–54)
AST: 36 U/L (ref 15–41)
Alkaline Phosphatase: 101 U/L (ref 38–126)
Anion gap: 12 (ref 5–15)
BUN: 11 mg/dL (ref 6–20)
CHLORIDE: 108 mmol/L (ref 101–111)
CO2: 22 mmol/L (ref 22–32)
CREATININE: 1 mg/dL (ref 0.44–1.00)
Calcium: 9.3 mg/dL (ref 8.9–10.3)
GFR calc Af Amer: >60 mL/min (ref >60)
GFR calc non Af Amer: >60 mL/min (ref >60)
Glucose, Bld: 128 mg/dL — ABNORMAL HIGH (ref 65–99)
POTASSIUM: 3.7 mmol/L (ref 3.5–5.1)
SODIUM: 142 mmol/L (ref 135–145)
Total Bilirubin: 0.3 mg/dL (ref 0.3–1.2)
Total Protein: 6.9 g/dL (ref 6.5–8.1)

## 2016-04-18 LAB — I-STAT CHEM 8, ED
BUN: 13 mg/dL (ref 6–20)
Calcium, Ion: 1.09 mmol/L — ABNORMAL LOW (ref 1.15–1.40)
Chloride: 111 mmol/L (ref 101–111)
Creatinine, Ser: 0.9 mg/dL (ref 0.44–1.00)
Glucose, Bld: 125 mg/dL — ABNORMAL HIGH (ref 65–99)
HEMATOCRIT: 41 % (ref 36.0–46.0)
HEMOGLOBIN: 13.9 g/dL (ref 12.0–15.0)
POTASSIUM: 3.6 mmol/L (ref 3.5–5.1)
SODIUM: 142 mmol/L (ref 135–145)
TCO2: 24 mmol/L (ref 0–100)

## 2016-04-18 LAB — URINALYSIS, ROUTINE W REFLEX MICROSCOPIC
Bilirubin Urine: NEGATIVE
GLUCOSE, UA: NEGATIVE mg/dL
HGB URINE DIPSTICK: NEGATIVE
KETONES UR: NEGATIVE mg/dL
LEUKOCYTES UA: NEGATIVE
Nitrite: NEGATIVE
PH: 5 (ref 5.0–8.0)
PROTEIN: NEGATIVE mg/dL
Specific Gravity, Urine: 1.011 (ref 1.005–1.030)

## 2016-04-18 LAB — PROTIME-INR
INR: 0.95
PROTHROMBIN TIME: 12.7 s (ref 11.4–15.2)

## 2016-04-18 LAB — CBC
HEMATOCRIT: 41.6 % (ref 36.0–46.0)
Hemoglobin: 13.7 g/dL (ref 12.0–15.0)
MCH: 27.8 pg (ref 26.0–34.0)
MCHC: 32.9 g/dL (ref 30.0–36.0)
MCV: 84.6 fL (ref 78.0–100.0)
PLATELETS: 211 10*3/uL (ref 150–400)
RBC: 4.92 MIL/uL (ref 3.87–5.11)
RDW: 15.5 % (ref 11.5–15.5)
WBC: 17.4 10*3/uL — ABNORMAL HIGH (ref 4.0–10.5)

## 2016-04-18 LAB — CDS SEROLOGY

## 2016-04-18 LAB — I-STAT CG4 LACTIC ACID, ED: Lactic Acid, Venous: 3.14 mmol/L (ref 0.5–1.9)

## 2016-04-18 LAB — SAMPLE TO BLOOD BANK

## 2016-04-18 MED ORDER — LIDOCAINE-EPINEPHRINE (PF) 2 %-1:200000 IJ SOLN
10.0000 mL | Freq: Once | INTRAMUSCULAR | Status: AC
  Administered 2016-04-18: 10 mL via INTRADERMAL
  Filled 2016-04-18: qty 20

## 2016-04-18 MED ORDER — TETANUS-DIPHTH-ACELL PERTUSSIS 5-2.5-18.5 LF-MCG/0.5 IM SUSP
0.5000 mL | Freq: Once | INTRAMUSCULAR | Status: AC
  Administered 2016-04-18: 0.5 mL via INTRAMUSCULAR

## 2016-04-18 MED ORDER — MORPHINE SULFATE (PF) 4 MG/ML IV SOLN
4.0000 mg | Freq: Once | INTRAVENOUS | Status: AC
  Administered 2016-04-18: 4 mg via INTRAVENOUS
  Filled 2016-04-18: qty 1

## 2016-04-18 MED ORDER — SODIUM CHLORIDE 0.9 % IV BOLUS (SEPSIS)
1000.0000 mL | Freq: Once | INTRAVENOUS | Status: AC
  Administered 2016-04-18: 1000 mL via INTRAVENOUS
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
## 2016-04-18 NOTE — ED Notes (Signed)
MD notified that suturing supplies at bedside

## 2016-04-18 NOTE — ED Notes (Signed)
A copy of lactic acid 3.14 given to MD Denton Lank and RN [place]

## 2016-04-18 NOTE — Progress Notes (Signed)
Chaplain checked in with the family to see if they had any needs.  Patient is not available at the moment as they are gone to have testing done.  Chaplain provided listening and presence for family.  Hospitality provided.  Chaplain will remain available as needed.   04/18/16 2137  Clinical Encounter Type  Visited With Family;Health care provider  Visit Type Initial;Spiritual support;Social support;Trauma  Stress Factors  Patient Stress Factors Health changes  Family Stress Factors Health changes   Beryl Meager

## 2016-04-19 ENCOUNTER — Encounter (HOSPITAL_BASED_OUTPATIENT_CLINIC_OR_DEPARTMENT_OTHER): Payer: Self-pay | Admitting: Orthopedic Surgery

## 2016-04-19 MED ORDER — OXYCODONE-ACETAMINOPHEN 5-325 MG PO TABS: 1.0000 | ORAL_TABLET | ORAL | 0 refills | Status: AC | PRN

## 2016-04-19 MED ORDER — HYDROCODONE-ACETAMINOPHEN 5-325 MG PO TABS
1.0000 | ORAL_TABLET | Freq: Once | ORAL | Status: AC
  Administered 2016-04-19: 1 via ORAL
  Filled 2016-04-19: qty 1

## 2016-04-19 NOTE — ED Notes (Signed)
When getting pt dressed, laceration noted to L posterior thigh. Dr.Petit at bedside for suturing
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
## 2016-04-26 ENCOUNTER — Ambulatory Visit: Payer: Medicaid Other | Admitting: Internal Medicine

## 2016-04-27 ENCOUNTER — Ambulatory Visit: Payer: Medicaid Other | Attending: Internal Medicine | Admitting: Internal Medicine

## 2016-04-27 ENCOUNTER — Encounter: Payer: Self-pay | Admitting: Internal Medicine

## 2016-04-27 VITALS — BP 109/72 | HR 95 | Temp 98.8°F | Resp 18 | Ht 67.0 in | Wt 210.0 lb

## 2016-04-27 DIAGNOSIS — I10 Essential (primary) hypertension: Secondary | ICD-10-CM | POA: Diagnosis present

## 2016-04-27 DIAGNOSIS — L03119 Cellulitis of unspecified part of limb: Secondary | ICD-10-CM | POA: Diagnosis not present

## 2016-04-27 DIAGNOSIS — Z880 Allergy status to penicillin: Secondary | ICD-10-CM | POA: Diagnosis not present

## 2016-04-27 DIAGNOSIS — E669 Obesity, unspecified: Secondary | ICD-10-CM | POA: Diagnosis not present

## 2016-04-27 DIAGNOSIS — M791 Myalgia: Secondary | ICD-10-CM | POA: Insufficient documentation

## 2016-04-27 DIAGNOSIS — X58XXXA Exposure to other specified factors, initial encounter: Secondary | ICD-10-CM | POA: Diagnosis not present

## 2016-04-27 DIAGNOSIS — E7800 Pure hypercholesterolemia, unspecified: Secondary | ICD-10-CM | POA: Insufficient documentation

## 2016-04-27 DIAGNOSIS — Z6832 Body mass index (BMI) 32.0-32.9, adult: Secondary | ICD-10-CM | POA: Diagnosis not present

## 2016-04-27 DIAGNOSIS — T1490XA Injury, unspecified, initial encounter: Secondary | ICD-10-CM

## 2016-04-27 DIAGNOSIS — E785 Hyperlipidemia, unspecified: Secondary | ICD-10-CM

## 2016-04-27 MED ORDER — PROBIOTIC 250 MG PO CAPS: 1.0000 | ORAL_CAPSULE | Freq: Two times a day (BID) | ORAL | 0 refills | Status: DC

## 2016-04-27 MED ORDER — PRAVASTATIN SODIUM 20 MG PO TABS: 20.0000 mg | ORAL_TABLET | Freq: Every day | ORAL | 3 refills | Status: DC

## 2016-04-27 MED ORDER — ACETAMINOPHEN-CODEINE #3 300-30 MG PO TABS: 1.0000 | ORAL_TABLET | Freq: Three times a day (TID) | ORAL | 0 refills | Status: DC | PRN

## 2016-04-27 MED ORDER — CYCLOBENZAPRINE HCL 10 MG PO TABS: 10.0000 mg | ORAL_TABLET | Freq: Three times a day (TID) | ORAL | 0 refills | Status: DC | PRN

## 2016-04-27 MED ORDER — AMLODIPINE BESYLATE 5 MG PO TABS
5.0000 mg | ORAL_TABLET | Freq: Every day | ORAL | 3 refills | Status: DC
Start: 2016-04-27 — End: 2016-05-26

## 2016-04-27 MED ORDER — CLINDAMYCIN HCL 300 MG PO CAPS: 300.0000 mg | ORAL_CAPSULE | Freq: Three times a day (TID) | ORAL | 0 refills | Status: DC

## 2016-04-27 MED FILL — PRAVASTATIN NA 20 MG TAB: 20 | 30 days supply | Qty: 30 | Fill #0

## 2016-04-27 MED FILL — ACETAMINOPHEN/COD #3 TABLET: 300-30 | 7 days supply | Qty: 45 | Fill #0

## 2016-04-27 MED FILL — ?AMLODIPINE BESYLATE 5 MG T: 5 | 30 days supply | Qty: 30 | Fill #0

## 2016-04-27 MED FILL — CLINDAMYCIN HCL 300 MG CAP: 300 | 7 days supply | Qty: 21 | Fill #0

## 2016-04-27 MED FILL — CYCLOBENZAPRINE 10 MG TAB: 10 | 15 days supply | Qty: 45 | Fill #0
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
## 2016-04-27 NOTE — Patient Instructions (Signed)
Take probiotics with antibiotics - Activia yogurt 2x day to prevent  diarrhea  RICE for Routine Care of Injuries Many injuries can be cared for using rest, ice, compression, and elevation (RICE therapy). Using RICE therapy can help to lessen pain and swelling. It can help your body to heal. Rest  Reduce your normal activities and avoid using the injured part of your body. You can go back to your normal activities when you feel okay and your doctor says it is okay. Ice  Do not put ice on your bare skin.  Put ice in a plastic bag.  Place a towel between your skin and the bag.  Leave the ice on for 20 minutes, 2-3 times a day. Do this for as long as told by your doctor. Compression  Compression means putting pressure on the injured area. This can be done with an elastic bandage. If an elastic bandage has been applied:  Remove and reapply the bandage every 3-4 hours or as told by your doctor.  Make sure the bandage is not wrapped too tight. Wrap the bandage more loosely if part of your body beyond the bandage is blue, swollen, cold, painful, or loses feeling (numb).  See your doctor if the bandage seems to make your problems worse. Elevation  Elevation means keeping the injured area raised. Raise the injured area above your heart or the center of your chest if you can. When should I get help? You should get help if:  You keep having pain and swelling.  Your symptoms get worse. Get help right away if: You should get help right away if:  You have sudden bad pain at or below the area of your injury.  You have redness or more swelling around your injury.  You have tingling or numbness at or below the injury that does not go away when you take off the bandage. This information is not intended to replace advice given to you by your health care provider. Make sure you discuss any questions you have with your health care provider. Document Released: 06/09/2007 Document Revised: 11/18/2015  Document Reviewed: 11/28/2013 Elsevier Interactive Patient Education  2017 [employer]  -  Cellulitis, Adult Cellulitis is a skin infection. The infected area is usually red and sore. This condition occurs most often in the arms and lower legs. It is very important to get treated for this condition. Follow these instructions at home:  Take over-the-counter and prescription medicines only as told by your doctor.  If you were prescribed an antibiotic medicine, take it as told by your doctor. Do not stop taking the antibiotic even if you start to feel better.  Drink enough fluid to keep your pee (urine) clear or pale yellow.  Do not touch or rub the infected area.  Raise (elevate) the infected area above the level of your heart while you are sitting or lying down.  Place warm or cold wet cloths (warm or cold compresses) on the infected area. Do this as told by your doctor.  Keep all follow-up visits as told by your doctor. This is important. These visits let your doctor make sure your infection is not getting worse. Contact a doctor if:  You have a fever.  Your symptoms do not get better after 1-2 days of treatment.  Your bone or joint under the infected area starts to hurt after the skin has healed.  Your infection comes back. This can happen in the same area or another area.  You have  a swollen bump in the infected area.  You have new symptoms.  You feel ill and also have muscle aches and pains. Get help right away if:  Your symptoms get worse.  You feel very sleepy.  You throw up (vomit) or have watery poop (diarrhea) for a long time.  There are red streaks coming from the infected area.  Your red area gets larger.  Your red area turns darker. This information is not intended to replace advice given to you by your health care provider. Make sure you discuss any questions you have with your health care provider. Document Released: 06/09/2007 Document Revised:  05/29/2015 Document Reviewed: 10/30/2014 Elsevier Interactive Patient Education  2017 [employer].
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
## 2016-05-05 ENCOUNTER — Encounter: Payer: Self-pay | Admitting: Internal Medicine

## 2016-05-05 ENCOUNTER — Ambulatory Visit: Payer: Self-pay | Attending: Internal Medicine | Admitting: Internal Medicine

## 2016-05-05 ENCOUNTER — Telehealth: Payer: Self-pay | Admitting: Internal Medicine

## 2016-05-05 VITALS — BP 142/96 | HR 85 | Temp 98.3°F | Resp 16 | Wt 207.6 lb

## 2016-05-05 DIAGNOSIS — Z880 Allergy status to penicillin: Secondary | ICD-10-CM | POA: Insufficient documentation

## 2016-05-05 DIAGNOSIS — E7800 Pure hypercholesterolemia, unspecified: Secondary | ICD-10-CM | POA: Insufficient documentation

## 2016-05-05 DIAGNOSIS — E669 Obesity, unspecified: Secondary | ICD-10-CM | POA: Insufficient documentation

## 2016-05-05 DIAGNOSIS — Z79899 Other long term (current) drug therapy: Secondary | ICD-10-CM | POA: Insufficient documentation

## 2016-05-05 DIAGNOSIS — S81811D Laceration without foreign body, right lower leg, subsequent encounter: Secondary | ICD-10-CM | POA: Insufficient documentation

## 2016-05-05 DIAGNOSIS — X58XXXD Exposure to other specified factors, subsequent encounter: Secondary | ICD-10-CM | POA: Insufficient documentation

## 2016-05-05 DIAGNOSIS — Z4802 Encounter for removal of sutures: Secondary | ICD-10-CM

## 2016-05-05 DIAGNOSIS — R7303 Prediabetes: Secondary | ICD-10-CM

## 2016-05-05 DIAGNOSIS — S81819D Laceration without foreign body, unspecified lower leg, subsequent encounter: Secondary | ICD-10-CM

## 2016-05-05 DIAGNOSIS — E785 Hyperlipidemia, unspecified: Secondary | ICD-10-CM

## 2016-05-05 DIAGNOSIS — I10 Essential (primary) hypertension: Secondary | ICD-10-CM

## 2016-05-05 LAB — POCT GLYCOSYLATED HEMOGLOBIN (HGB A1C): HEMOGLOBIN A1C: 5.5

## 2016-05-05 NOTE — Telephone Encounter (Signed)
Called pt, no answer.  Unable to leave vm.  [place] - could you call her when can and tell her she does not have diabetes or prediabetes. No metformin needed. thanks
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
## 2016-05-05 NOTE — Patient Instructions (Signed)
Audrey Pen RN 2 wks  - bp check  -   Low-Sodium Eating Plan Sodium, which is an element that makes up salt, helps you maintain a healthy balance of fluids in your body. Too much sodium can increase your blood pressure and cause fluid and waste to be held in your body. Your health care provider or dietitian may recommend following this plan if you have high blood pressure (hypertension), kidney disease, liver disease, or heart failure. Eating less sodium can help lower your blood pressure, reduce swelling, and protect your heart, liver, and kidneys. What are tips for following this plan? General guidelines   Most people on this plan should limit their sodium intake to 1,500-2,000 mg (milligrams) of sodium each day. Reading food labels   The Nutrition Facts label lists the amount of sodium in one serving of the food. If you eat more than one serving, you must multiply the listed amount of sodium by the number of servings.  Choose foods with less than 140 mg of sodium per serving.  Avoid foods with 300 mg of sodium or more per serving. Shopping   Look for lower-sodium products, often labeled as "low-sodium" or "no salt added."  Always check the sodium content even if foods are labeled as "unsalted" or "no salt added".  Buy fresh foods.  Avoid canned foods and premade or frozen meals.  Avoid canned, cured, or processed meats  Buy breads that have less than 80 mg of sodium per slice. Cooking   Eat more home-cooked food and less restaurant, buffet, and fast food.  Avoid adding salt when cooking. Use salt-free seasonings or herbs instead of table salt or sea salt. Check with your health care provider or pharmacist before using salt substitutes.  Cook with plant-based oils, such as canola, sunflower, or olive oil. Meal planning   When eating at a restaurant, ask that your food be prepared with less salt or no salt, if possible.  Avoid foods that contain MSG (monosodium glutamate).  MSG is sometimes added to [place] food, bouillon, and some canned foods. What foods are recommended? The items listed may not be a complete list. Talk with your dietitian about what dietary choices are best for you. Grains  Low-sodium cereals, including oats, puffed wheat and rice, and shredded wheat. Low-sodium crackers. Unsalted rice. Unsalted pasta. Low-sodium bread. Whole-grain breads and whole-grain pasta. Vegetables  Fresh or frozen vegetables. "No salt added" canned vegetables. "No salt added" tomato sauce and paste. Low-sodium or reduced-sodium tomato and vegetable juice. Fruits  Fresh, frozen, or canned fruit. Fruit juice. Meats and other protein foods  Fresh or frozen (no salt added) meat, poultry, seafood, and fish. Low-sodium canned tuna and salmon. Unsalted nuts. Dried peas, beans, and lentils without added salt. Unsalted canned beans. Eggs. Unsalted nut butters. Dairy  Milk. Soy milk. Cheese that is naturally low in sodium, such as ricotta cheese, fresh mozzarella, or Swiss cheese Low-sodium or reduced-sodium cheese. Cream cheese. Yogurt. Fats and oils  Unsalted butter. Unsalted margarine with no trans fat. Vegetable oils such as canola or olive oils. Seasonings and other foods  Fresh and dried herbs and spices. Salt-free seasonings. Low-sodium mustard and ketchup. Sodium-free salad dressing. Sodium-free light mayonnaise. Fresh or refrigerated horseradish. Lemon juice. Vinegar. Homemade, reduced-sodium, or low-sodium soups. Unsalted popcorn and pretzels. Low-salt or salt-free chips. What foods are not recommended? The items listed may not be a complete list. Talk with your dietitian about what dietary choices are best for you. Grains  Instant  hot cereals. Bread stuffing, pancake, and biscuit mixes. Croutons. Seasoned rice or pasta mixes. Noodle soup cups. Boxed or frozen macaroni and cheese. Regular salted crackers. Self-rising flour. Vegetables  Sauerkraut, pickled vegetables,  and relishes. Olives. [place] fries. Onion rings. Regular canned vegetables (not low-sodium or reduced-sodium). Regular canned tomato sauce and paste (not low-sodium or reduced-sodium). Regular tomato and vegetable juice (not low-sodium or reduced-sodium). Frozen vegetables in sauces. Meats and other protein foods  Meat or fish that is salted, canned, smoked, spiced, or pickled. Bacon, ham, sausage, hotdogs, corned beef, chipped beef, packaged lunch meats, salt pork, jerky, pickled herring, anchovies, regular canned tuna, sardines, salted nuts. Dairy  Processed cheese and cheese spreads. Cheese curds. Blue cheese. Feta cheese. String cheese. Regular cottage cheese. Buttermilk. Canned milk. Fats and oils  Salted butter. Regular margarine. Ghee. Bacon fat. Seasonings and other foods  Onion salt, garlic salt, seasoned salt, table salt, and sea salt. Canned and packaged gravies. Worcestershire sauce. Tartar sauce. Barbecue sauce. Teriyaki sauce. Soy sauce, including reduced-sodium. Steak sauce. Fish sauce. Oyster sauce. Cocktail sauce. Horseradish that you find on the shelf. Regular ketchup and mustard. Meat flavorings and tenderizers. Bouillon cubes. Hot sauce and Tabasco sauce. Premade or packaged marinades. Premade or packaged taco seasonings. Relishes. Regular salad dressings. Salsa. Potato and tortilla chips. Corn chips and puffs. Salted popcorn and pretzels. Canned or dried soups. Pizza. Frozen entrees and pot pies. Summary  Eating less sodium can help lower your blood pressure, reduce swelling, and protect your heart, liver, and kidneys.  Most people on this plan should limit their sodium intake to 1,500-2,000 mg (milligrams) of sodium each day.  Canned, boxed, and frozen foods are high in sodium. Restaurant foods, fast foods, and pizza are also very high in sodium. You also get sodium by adding salt to food.  Try to cook at home, eat more fresh fruits and vegetables, and eat less fast food,  canned, processed, or prepared foods. This information is not intended to replace advice given to you by your health care provider. Make sure you discuss any questions you have with your health care provider. Document Released: 06/12/2001 Document Revised: 12/15/2015 Document Reviewed: 12/15/2015 Elsevier Interactive Patient Education  2017 Elsevier Inc.  -   Hypertension Hypertension is another name for high blood pressure. High blood pressure forces your heart to work harder to pump blood. This can cause problems over time. There are two numbers in a blood pressure reading. There is a top number (systolic) over a bottom number (diastolic). It is best to have a blood pressure below 120/80. Healthy choices can help lower your blood pressure. You may need medicine to help lower your blood pressure if:  Your blood pressure cannot be lowered with healthy choices.  Your blood pressure is higher than 130/80. Follow these instructions at home: Eating and drinking   If directed, follow the DASH eating plan. This diet includes:  Filling half of your plate at each meal with fruits and vegetables.  Filling one quarter of your plate at each meal with whole grains. Whole grains include whole wheat pasta, brown rice, and whole grain bread.  Eating or drinking low-fat dairy products, such as skim milk or low-fat yogurt.  Filling one quarter of your plate at each meal with low-fat (lean) proteins. Low-fat proteins include fish, skinless chicken, eggs, beans, and tofu.  Avoiding fatty meat, cured and processed meat, or chicken with skin.  Avoiding premade or processed food.  Eat less than 1,500 mg of salt (  sodium) a day.  Limit alcohol use to no more than 1 drink a day for nonpregnant women and 2 drinks a day for men. One drink equals 12 oz of beer, 5 oz of wine, or 1 oz of hard liquor. Lifestyle   Work with your doctor to stay at a healthy weight or to lose weight. Ask your doctor what the  best weight is for you.  Get at least 30 minutes of exercise that causes your heart to beat faster (aerobic exercise) most days of the week. This may include walking, swimming, or biking.  Get at least 30 minutes of exercise that strengthens your muscles (resistance exercise) at least 3 days a week. This may include lifting weights or pilates.  Do not use any products that contain nicotine or tobacco. This includes cigarettes and e-cigarettes. If you need help quitting, ask your doctor.  Check your blood pressure at home as told by your doctor.  Keep all follow-up visits as told by your doctor. This is important. Medicines   Take over-the-counter and prescription medicines only as told by your doctor. Follow directions carefully.  Do not skip doses of blood pressure medicine. The medicine does not work as well if you skip doses. Skipping doses also puts you at risk for problems.  Ask your doctor about side effects or reactions to medicines that you should watch for. Contact a doctor if:  You think you are having a reaction to the medicine you are taking.  You have headaches that keep coming back (recurring).  You feel dizzy.  You have swelling in your ankles.  You have trouble with your vision. Get help right away if:  You get a very bad headache.  You start to feel confused.  You feel weak or numb.  You feel faint.  You get very bad pain in your:  Chest.  Belly (abdomen).  You throw up (vomit) more than once.  You have trouble breathing. Summary  Hypertension is another name for high blood pressure.  Making healthy choices can help lower blood pressure. If your blood pressure cannot be controlled with healthy choices, you may need to take medicine. This information is not intended to replace advice given to you by your health care provider. Make sure you discuss any questions you have with your health care provider. Document Released: 06/09/2007 Document  Revised: 11/19/2015 Document Reviewed: 11/19/2015 Elsevier Interactive Patient Education  2017 Elsevier Inc.  -  Prediabetes Prediabetes is the condition of having a blood sugar (blood glucose) level that is higher than it should be, but not high enough for you to be diagnosed with type 2 diabetes. Having prediabetes puts you at risk for developing type 2 diabetes (type 2 diabetes mellitus). Prediabetes may be called impaired glucose tolerance or impaired fasting glucose. Prediabetes usually does not cause symptoms. Your health care provider can diagnose this condition with blood tests. You may be tested for prediabetes if you are overweight and if you have at least one other risk factor for prediabetes. Risk factors for prediabetes include:  Having a family member with type 2 diabetes.  Being overweight or obese.  Being older than age 51.  Being of American-Indian, African-American, Hispanic/Latino, or Asian/Pacific Islander descent.  Having an inactive (sedentary) lifestyle.  Having a history of gestational diabetes or polycystic ovarian syndrome (PCOS).  Having low levels of good cholesterol (HDL-C) or high levels of blood fats (triglycerides).  Having high blood pressure. What is blood glucose and how is blood  glucose measured?   Blood glucose refers to the amount of glucose in your bloodstream. Glucose comes from eating foods that contain sugars and starches (carbohydrates) that the body breaks down into glucose. Your blood glucose level may be measured in mg/dL (milligrams per deciliter) or mmol/L (millimoles per liter).Your blood glucose may be checked with one or more of the following blood tests:  A fasting blood glucose (FBG) test. You will not be allowed to eat (you will fast) for at least 8 hours before a blood sample is taken.  A normal range for FBG is 70-100 mg/dl (1.6-1.0 mmol/L).  An A1c (hemoglobin A1c) blood test. This test provides information about blood  glucose control over the previous 2?3months.  An oral glucose tolerance test (OGTT). This test measures your blood glucose twice:  After fasting. This is your baseline level.  Two hours after you drink a beverage that contains glucose. You may be diagnosed with prediabetes:  If your FBG is 100?125 mg/dL (9.6-0.4 mmol/L).  If your A1c level is 5.7?6.4%.  If your OGGT result is 140?199 mg/dL (5.4-09 mmol/L). These blood tests may be repeated to confirm your diagnosis. What happens if blood glucose is too high? The pancreas produces a hormone (insulin) that helps move glucose from the bloodstream into cells. When cells in the body do not respond properly to insulin that the body makes (insulin resistance), excess glucose builds up in the blood instead of going into cells. As a result, high blood glucose (hyperglycemia) can develop, which can cause many complications. This is a symptom of prediabetes. What can happen if blood glucose stays higher than normal for a long time? Having high blood glucose for a long time is dangerous. Too much glucose in your blood can damage your nerves and blood vessels. Long-term damage can lead to complications from diabetes, which may include:  Heart disease.  Stroke.  Blindness.  Kidney disease.  Depression.  Poor circulation in the feet and legs, which could lead to surgical removal (amputation) in severe cases. How can prediabetes be prevented from turning into type 2 diabetes?   To help prevent type 2 diabetes, take the following actions:  Be physically active.  Do moderate-intensity physical activity for at least 30 minutes on at least 5 days of the week, or as much as told by your health care provider. This could be brisk walking, biking, or water aerobics.  Ask your health care provider what activities are safe for you. A mix of physical activities may be best, such as walking, swimming, cycling, and strength training.  Lose weight as  told by your health care provider.  Losing 5-7% of your body weight can reverse insulin resistance.  Your health care provider can determine how much weight loss is best for you and can help you lose weight safely.  Follow a healthy meal plan. This includes eating lean proteins, complex carbohydrates, fresh fruits and vegetables, low-fat dairy products, and healthy fats.  Follow instructions from your health care provider about eating or drinking restrictions.  Make an appointment to see a diet and nutrition specialist (registered dietitian) to help you create a healthy eating plan that is right for you.  Do not smoke or use any tobacco products, such as cigarettes, chewing tobacco, and e-cigarettes. If you need help quitting, ask your health care provider.  Take over-the-counter and prescription medicines as told by your health care provider. You may be prescribed medicines that help lower the risk of type 2 diabetes. This  information is not intended to replace advice given to you by your health care provider. Make sure you discuss any questions you have with your health care provider. Document Released: 04/14/2015 Document Revised: 05/29/2015 Document Reviewed: 02/11/2015 Elsevier Interactive Patient Education  2017 [employer].

## 2016-05-05 NOTE — Telephone Encounter (Signed)
Tried contacting pt again pt didn't answer and was unable to lvm

## 2016-05-19 ENCOUNTER — Ambulatory Visit: Payer: Self-pay

## 2016-05-24 MED FILL — PRAVASTATIN NA 20 MG TAB: 20 | 30 days supply | Qty: 30 | Fill #1

## 2016-05-24 MED FILL — ?AMLODIPINE BESYLATE 5 MG T: 5 | 30 days supply | Qty: 30 | Fill #1

## 2016-05-26 ENCOUNTER — Ambulatory Visit: Payer: Medicaid Other | Attending: Internal Medicine | Admitting: *Deleted

## 2016-05-26 VITALS — BP 132/90 | HR 75

## 2016-05-26 DIAGNOSIS — Z0130 Encounter for examination of blood pressure without abnormal findings: Secondary | ICD-10-CM

## 2016-05-26 DIAGNOSIS — I10 Essential (primary) hypertension: Secondary | ICD-10-CM

## 2016-05-26 MED ORDER — AMLODIPINE BESYLATE 10 MG PO TABS: 10.0000 mg | ORAL_TABLET | Freq: Every day | ORAL | 3 refills | Status: DC

## 2016-05-26 NOTE — Progress Notes (Signed)
[facility] alert and oriented and arrives in good spirits.  Last OV 05/05/16  with Dr.Langeland.   Pt denies chest pain, SOB, HA, dizziness, or blurred vision.   She is concerned with swelling in right lower extremity. Since having sutures 04/18/16, she has noticed a hardened area. She also has  +1 pitting edema in right lower extremity. No swelling assessed in left lower extremity. Discomfort to lump when palpated. No pain with flexion and dorsiflexion.  Encourage to elevate extremity and f/u with Dr. Laural BenesJohnson on 05/27/16 at 1130.  Verified medication.Pt states BP medication was taken this morning. Education provided on acetaminophen toxicity. Pt states she does not take Tylenol #3  because it does not help.   Manual blood pressure reading: 142/90  Medication change:  Norvasc 10 mg daily.   Pt verbalized understanding.

## 2016-05-27 ENCOUNTER — Ambulatory Visit: Payer: Self-pay | Attending: Internal Medicine | Admitting: Internal Medicine

## 2016-05-27 ENCOUNTER — Telehealth: Payer: Self-pay | Admitting: *Deleted

## 2016-05-27 ENCOUNTER — Ambulatory Visit (HOSPITAL_COMMUNITY)
Admission: RE | Admit: 2016-05-27 | Discharge: 2016-05-27 | Disposition: A | Discharge status: Home / Self Care | Payer: Self-pay | Source: Ambulatory Visit | Attending: Internal Medicine | Admitting: Internal Medicine

## 2016-05-27 ENCOUNTER — Encounter: Payer: Self-pay | Admitting: Internal Medicine

## 2016-05-27 VITALS — BP 116/80 | HR 75 | Temp 98.1°F | Resp 16 | Ht 67.0 in | Wt 207.2 lb

## 2016-05-27 DIAGNOSIS — R229 Localized swelling, mass and lump, unspecified: Secondary | ICD-10-CM

## 2016-05-27 DIAGNOSIS — E781 Pure hyperglyceridemia: Secondary | ICD-10-CM | POA: Insufficient documentation

## 2016-05-27 DIAGNOSIS — M7989 Other specified soft tissue disorders: Secondary | ICD-10-CM

## 2016-05-27 DIAGNOSIS — I10 Essential (primary) hypertension: Secondary | ICD-10-CM | POA: Insufficient documentation

## 2016-05-27 DIAGNOSIS — E669 Obesity, unspecified: Secondary | ICD-10-CM | POA: Insufficient documentation

## 2016-05-27 DIAGNOSIS — E785 Hyperlipidemia, unspecified: Secondary | ICD-10-CM | POA: Insufficient documentation

## 2016-05-27 DIAGNOSIS — M79661 Pain in right lower leg: Secondary | ICD-10-CM

## 2016-05-27 DIAGNOSIS — M79604 Pain in right leg: Secondary | ICD-10-CM

## 2016-05-27 DIAGNOSIS — Z880 Allergy status to penicillin: Secondary | ICD-10-CM | POA: Insufficient documentation

## 2016-05-27 NOTE — Progress Notes (Signed)
VASCULAR LAB PRELIMINARY  PRELIMINARY  PRELIMINARY  PRELIMINARY  Right lower extremity venous duplex completed.    Preliminary report: Right:  No evidence of DVT, superficial thrombosis, or Baker's cyst.  Kloi Brodman, RVS 05/27/2016, 3:44 PM

## 2016-05-27 NOTE — Telephone Encounter (Signed)
[place] from Vascular lab called to report patient results: Neg for thrombus or Baker's cyst. Notified Dr. Laural BenesJohnson. Pt to received instructions by Dr. Laural BenesJohnson.
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
## 2016-05-27 NOTE — Telephone Encounter (Signed)
Doppler US was negative for DVT, superficial clots and Bakers cyst per radiology tech. I spoke with pt and inform her of results.  Again, I think likely resolving hematoma.  Pt advised to apply warm compresses 2 times a day for 20 mins, keep leg elevated and f/u with me in 1 mth to make sure this has resolved.  She expressed understanding and was given opportunity to ask questions.

## 2016-05-27 NOTE — Patient Instructions (Signed)
We will get a venous doppler ultrasound to evaluate for blood clot.  If positive, we will need to start you on a blood thinner called Xarelto.  If it is a hematoma, you will need to apply warm compresses to the area. Try to keep leg elevated.

## 2016-06-28 MED FILL — AMLODIPINE BESYLATE 5 MG TA: 5 | 30 days supply | Qty: 30 | Fill #2

## 2016-06-28 MED FILL — PRAVASTATIN NA 20 MG TAB: 20 | 30 days supply | Qty: 30 | Fill #2

## 2016-08-09 MED FILL — PRAVASTATIN NA 20 MG TAB: 20 | 30 days supply | Qty: 30 | Fill #3

## 2016-08-10 MED FILL — AMLODIPINE BESYLATE 5 MG TA: 5 | 30 days supply | Qty: 30 | Fill #3

## 2016-08-16 ENCOUNTER — Ambulatory Visit: Payer: Self-pay | Attending: Internal Medicine | Admitting: Physician Assistant

## 2016-08-16 VITALS — BP 129/89 | HR 78 | Temp 97.7°F | Resp 16 | Wt 216.0 lb

## 2016-08-16 DIAGNOSIS — G8929 Other chronic pain: Secondary | ICD-10-CM | POA: Insufficient documentation

## 2016-08-16 DIAGNOSIS — M25512 Pain in left shoulder: Secondary | ICD-10-CM | POA: Insufficient documentation

## 2016-08-16 DIAGNOSIS — Z880 Allergy status to penicillin: Secondary | ICD-10-CM | POA: Insufficient documentation

## 2016-08-16 DIAGNOSIS — Z79899 Other long term (current) drug therapy: Secondary | ICD-10-CM | POA: Insufficient documentation

## 2016-08-16 DIAGNOSIS — M7989 Other specified soft tissue disorders: Secondary | ICD-10-CM | POA: Insufficient documentation

## 2016-08-16 DIAGNOSIS — I10 Essential (primary) hypertension: Secondary | ICD-10-CM | POA: Insufficient documentation

## 2016-08-16 MED ORDER — IBUPROFEN 600 MG PO TABS: 600.0000 mg | ORAL_TABLET | Freq: Three times a day (TID) | ORAL | 0 refills | Status: DC | PRN

## 2016-08-16 NOTE — Progress Notes (Signed)
Audrey KelchMicah Taylor, is a 46 y.o. female  UEA:[id number]:[id number]  YNW:[id number]  DOB - 07/29/1970  Subjective:  Chief Complaint and HPI: Audrey Taylor is a 46 y.o. female here today for continued and chronic L shoulder pain since [place] [facility] [place].  Xrays at that time negative.  She continues to have pain despite ibuprofen, rest, ice , and trying to do home stretching and exercises.  She has had a rotator cuff tear on the R before and "this feels similar." No numbness or weakness.  She does c/o limited ROM and pain being worse at night.  Also, she has had chronic R lower leg swelling and tenderness since these injuries.  There is also a small knot in the area.  She denies SOB.    ROS:   Constitutional:  No f/c, No night sweats, No unexplained weight loss. EENT:  No vision changes, No blurry vision, No hearing changes. No mouth, throat, or ear problems.  Respiratory: No cough, No SOB Cardiac: No CP, no palpitations GI:  No abd pain, No N/V/D. GU: No Urinary s/sx Musculoskeletal: +L shoulder, R lower leg pain Neuro: No headache, no dizziness, no motor weakness.  Skin: No rash Endocrine:  No polydipsia. No polyuria.  Psych: Denies SI/HI  No problems updated.  ALLERGIES: Allergies  Allergen Reactions  . Penicillins Other (See Comments)    Childhood allergy  . Penicillins Rash    Childhood allergic reaction Has patient had a PCN reaction causing immediate rash, facial/tongue/throat swelling, SOB or lightheadedness with hypotension: Yes Has patient had a PCN reaction causing severe rash involving mucus membranes or skin necrosis: No Has patient had a PCN reaction that required hospitalization No Has patient had a PCN reaction occurring within the last 10 years: No If all of the above answers are "NO", then may proceed with Cephalosporin use.    PAST MEDICAL HISTORY: Past Medical History:  Diagnosis Date  . Complete tear of right rotator cuff 08/28/2015  .  Hypercholesteremia   . Hyperlipidemia   . Hypertension   . Menorrhagia   . Obesity   . Shoulder pain     MEDICATIONS AT HOME: Prior to Admission medications   Medication Sig Start Date End Date Taking? Authorizing Provider  acetaminophen (TYLENOL) 500 MG tablet Take 500 mg by mouth at bedtime.   Yes [provider]  amLODipine (NORVASC) 10 MG tablet Take 1 tablet (10 mg total) by mouth daily. 05/26/16  Yes Langeland, Dawn T, MD  pravastatin (PRAVACHOL) 20 MG tablet Take 1 tablet (20 mg total) by mouth daily. 04/27/16  Yes Langeland, Dawn T, MD  acetaminophen-codeine (TYLENOL #3) 300-30 MG tablet Take 1-2 tablets by mouth every 8 (eight) hours as needed for moderate pain. Patient not taking: Reported on 05/27/2016 04/27/16   Pete GlatterLangeland, Dawn T, MD  cyclobenzaprine (FLEXERIL) 10 MG tablet Take 1 tablet (10 mg total) by mouth 3 (three) times daily as needed for muscle spasms. Patient not taking: Reported on 05/26/2016 04/27/16   Pete GlatterLangeland, Dawn T, MD  ibuprofen (ADVIL,MOTRIN) 600 MG tablet Take 1 tablet (600 mg total) by mouth every 8 (eight) hours as needed. 08/16/16   Anders SimmondsMcClung, Romaine Maciolek M, PA-C     Objective:  EXAM:   Vitals:   08/16/16 1056  BP: 129/89  Pulse: 78  Resp: 16  Temp: 97.7 F (36.5 C)  TempSrc: Oral  SpO2: 96%  Weight: 216 lb (98 kg)    General appearance : A&OX3. NAD. Non-toxic-appearing  HEENT: Atraumatic and Normocephalic.  PERRLA. EOM intact.  Neck: supple, no JVD. No cervical lymphadenopathy. No thyromegaly Chest/Lungs:  Breathing-non-labored, Good air entry bilaterally, breath sounds normal without rales, rhonchi, or wheezing  CVS: S1 S2 regular, no murmurs, gallops, rubs  Abdomen: Bowel sounds present, Non tender and not distended with no gaurding, rigidity or rebound. Extremities: L arm/shoulder-no gross deformity or swelling-limited internal and external rotation; ROM otherwise WNL.  +Neer's and Hawkins.  Neg empty can/Speed's test.  Bilateral Lower Ext  examined-RLL <1+ edema with mild calf tenderness.  No erythema and neg Homan's.  There is a firm 1 cm nodule just distal and medial to the popliteal fossa.   Neurology:  CN II-XII grossly intact, Non focal.   Psych:  TP linear. J/I WNL. Normal speech. Appropriate eye contact and affect.  Skin:  No Rash  Data Review Lab Results  Component Value Date   HGBA1C 5.5 05/05/2016   HGBA1C 5.6 08/13/2015   HGBA1C 5.9 05/29/2013     Assessment & Plan   1. HTN (hypertension), benign Controlled, continue current regimen.    2. Left shoulder pain, unspecified chronicity - ibuprofen (ADVIL,MOTRIN) 600 MG tablet; Take 1 tablet (600 mg total) by mouth every 8 (eight) hours as needed.  Dispense: 30 tablet; Refill: 0 - MR Shoulder Left Wo Contrast; Future -will refer pending MRI  3. Right leg swelling - VAS [place] LOWER EXTREMITY VENOUS (DVT); Future  Patient have been counseled extensively about nutrition and exercise  Return for keep next appt with Dr Laural Benes as scheduled.  The patient was given clear instructions to go to ER or return to medical center if symptoms don't improve, worsen or new problems develop. The patient verbalized understanding. The patient was told to call to get lab results if they haven't heard anything in the next week.     Georgian Co, PA-C [facility] [place], [place] [phone]   08/16/2016, 11:53 AM

## 2016-08-16 NOTE — Progress Notes (Signed)
Left shoulder pain  RLE pain and numbness

## 2016-08-17 ENCOUNTER — Ambulatory Visit (HOSPITAL_COMMUNITY)
Admission: RE | Admit: 2016-08-17 | Discharge: 2016-08-17 | Disposition: A | Discharge status: Home / Self Care | Payer: Self-pay | Source: Ambulatory Visit | Attending: Physician Assistant | Admitting: Physician Assistant

## 2016-08-17 DIAGNOSIS — M7989 Other specified soft tissue disorders: Secondary | ICD-10-CM

## 2016-08-17 DIAGNOSIS — M79604 Pain in right leg: Secondary | ICD-10-CM | POA: Insufficient documentation

## 2016-08-17 NOTE — Progress Notes (Signed)
*  PRELIMINARY RESULTS* Vascular Ultrasound Right lower extremity venous duplex has been completed.  Preliminary findings: No evidence of deep vein thrombosis or baker's cysts in the right lower extremity.  Nothing seen medial popliteal fossa, area of pain.  Preliminary results called to [facility] @ 14:14.   Chauncey FischerCharlotte C Shaman Muscarella 08/17/2016, 2:10 PM

## 2016-08-20 ENCOUNTER — Ambulatory Visit (HOSPITAL_COMMUNITY)
Admission: RE | Admit: 2016-08-20 | Discharge: 2016-08-20 | Disposition: A | Discharge status: Home / Self Care | Payer: Self-pay | Source: Ambulatory Visit | Attending: Physician Assistant | Admitting: Physician Assistant

## 2016-08-20 DIAGNOSIS — M25512 Pain in left shoulder: Secondary | ICD-10-CM | POA: Insufficient documentation

## 2016-08-20 DIAGNOSIS — M75122 Complete rotator cuff tear or rupture of left shoulder, not specified as traumatic: Secondary | ICD-10-CM | POA: Insufficient documentation

## 2016-08-20 DIAGNOSIS — M7582 Other shoulder lesions, left shoulder: Secondary | ICD-10-CM | POA: Insufficient documentation

## 2016-08-23 ENCOUNTER — Other Ambulatory Visit: Payer: Self-pay | Admitting: Physician Assistant

## 2016-08-23 DIAGNOSIS — T148XXA Other injury of unspecified body region, initial encounter: Secondary | ICD-10-CM

## 2016-10-04 MED FILL — PRAVASTATIN NA 20 MG TAB: 20 | 30 days supply | Qty: 30 | Fill #4

## 2016-10-04 MED FILL — AMLODIPINE BESYLATE 5 MG TA: 5 | 30 days supply | Qty: 30 | Fill #4

## 2016-12-02 MED FILL — AMLODIPINE BESYLATE 5 MG TA: 5 | 30 days supply | Qty: 30 | Fill #5

## 2016-12-02 MED FILL — PRAVASTATIN NA 20 MG TAB: 20 | 30 days supply | Qty: 30 | Fill #5

## 2017-01-25 MED FILL — PRAVASTATIN NA 20 MG TAB: 20 | 30 days supply | Qty: 30 | Fill #6

## 2017-01-25 MED FILL — ?AMLODIPINE BESYLATE 5 MG T: 5 MG | 30 days supply | Qty: 30 | Fill #6

## 2017-04-23 IMAGING — MR MR SHOULDER*R* W/O CM
4 of 5 series · 23 of 40 positions shown · non-contrast
Comparison: Radiographs 08/07/2015.

CLINICAL DATA: 44-year-old with increasing shoulder pain for 5
years. Patient reports previous tissue tear in right shoulder.
Weakness with limited weight-bearing. Some relief from steroid
injections 4 months ago.

EXAM:
MRI OF THE RIGHT SHOULDER WITHOUT CONTRAST
TECHNIQUE: Multiplanar, multisequence MR imaging of the shoulder was performed.
No intravenous contrast was administered.

[Series 3: T2 fat-sat · oblique · 4.0mm · 0.55mm/px · 8 of 20 slices shown (1 of 3)]
[im 1/20]
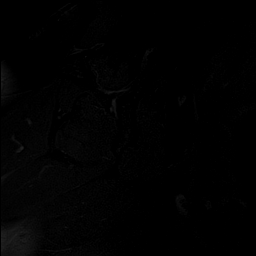
[im 3/20]
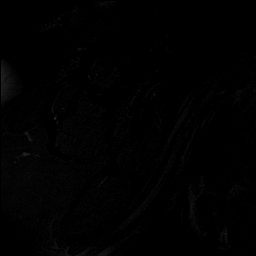
[im 6/20]
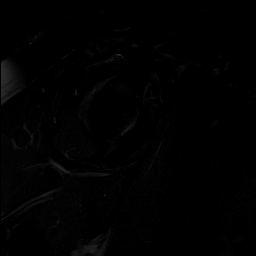
[im 9/20]
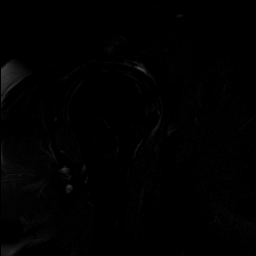
[im 11/20]
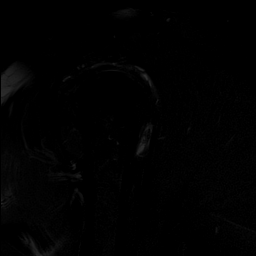
[im 14/20]
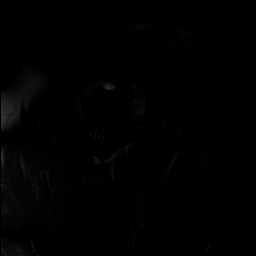
[im 17/20]
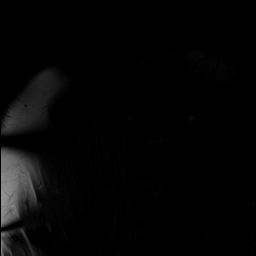
[im 20/20]
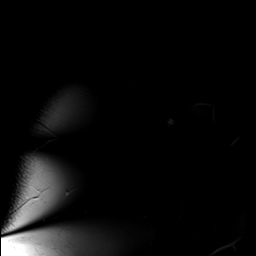

[Series 4: T2 fat-sat · oblique · 4.0mm · 0.27mm/px · 5 of 17 slices shown (2 of 3)]
[im 1/17]
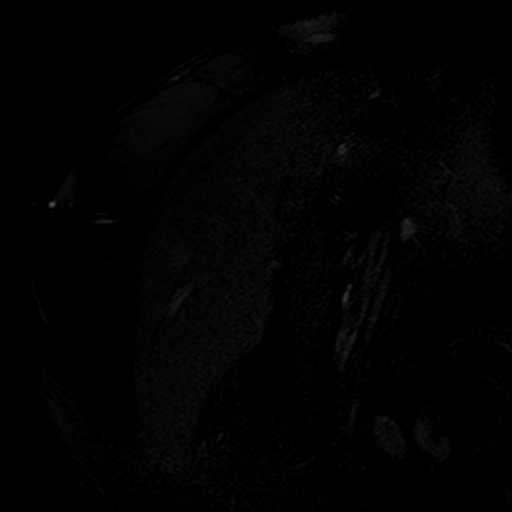
[im 3/17]
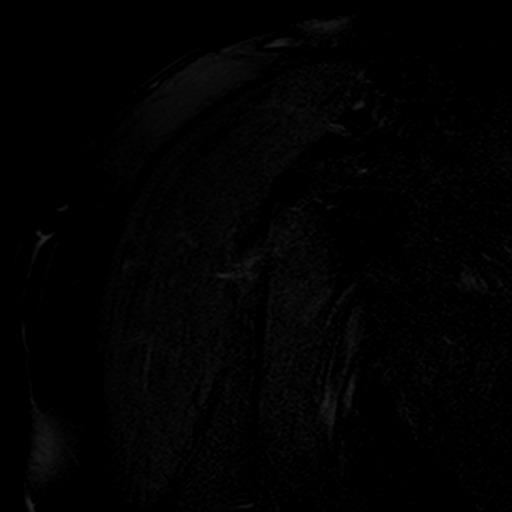
[im 6/17]
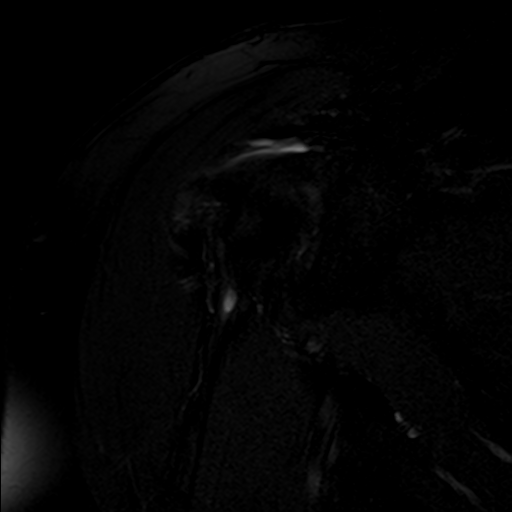
[im 9/17]
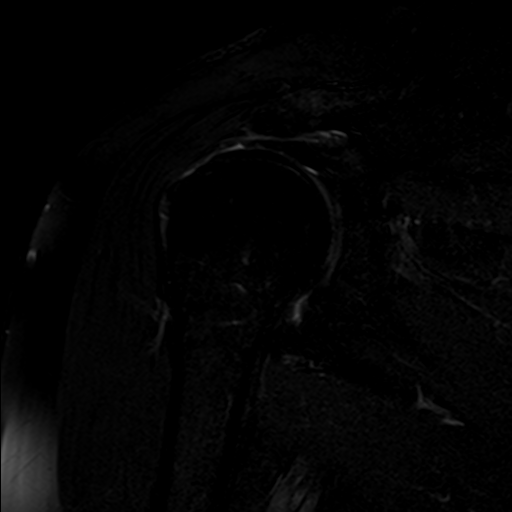
[im 14/17]
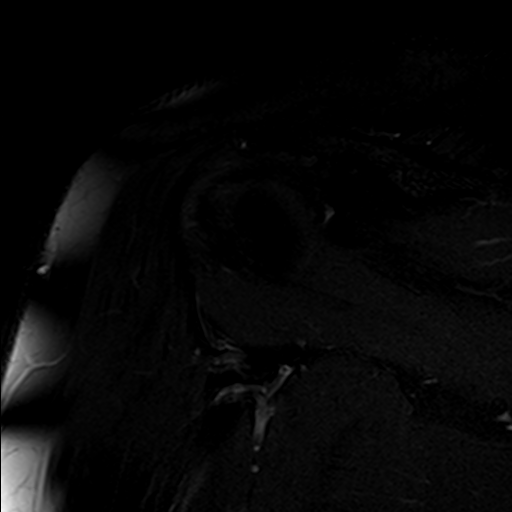

[Series 5: T2 fat-sat · axial · 4.0mm · 0.55mm/px · z∈[-3,+62]mm · 3 of 20 slices shown (3 of 3)]
[im 3/20]
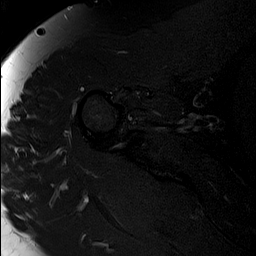
[im 10/20]
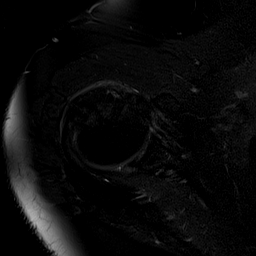
[im 17/20]
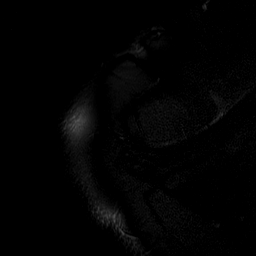

[Series 7: PD · oblique · 4.0mm · 0.22mm/px · 7 of 17 slices shown]
[im 1/17]
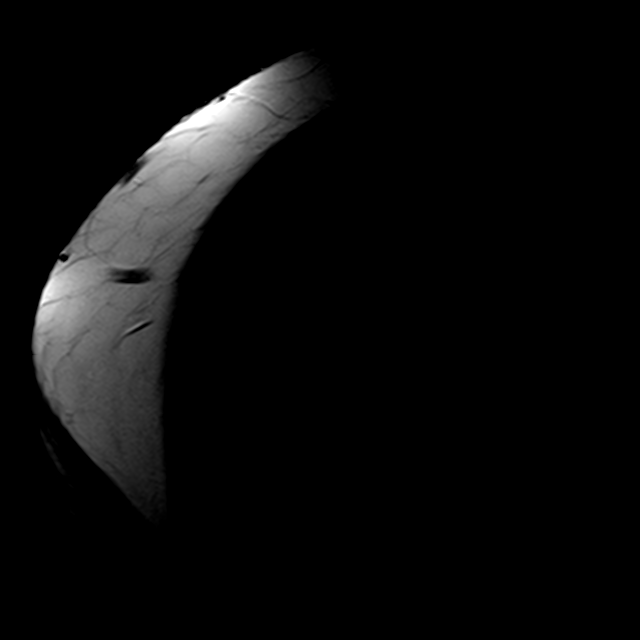
[im 3/17]
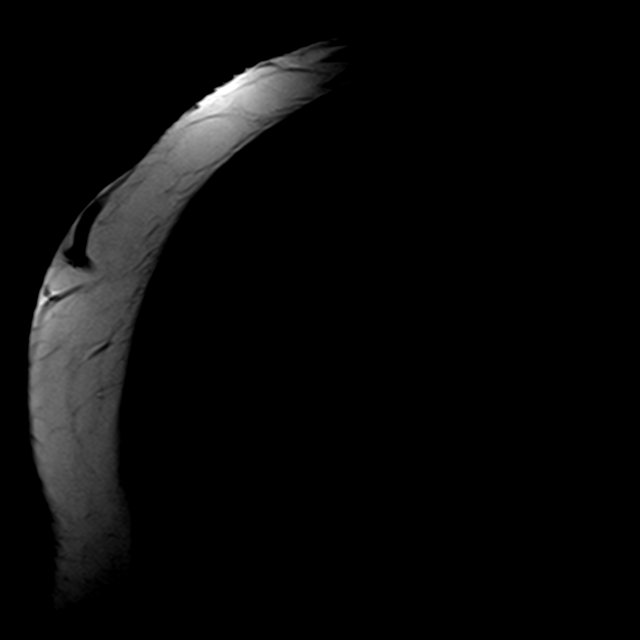
[im 6/17]
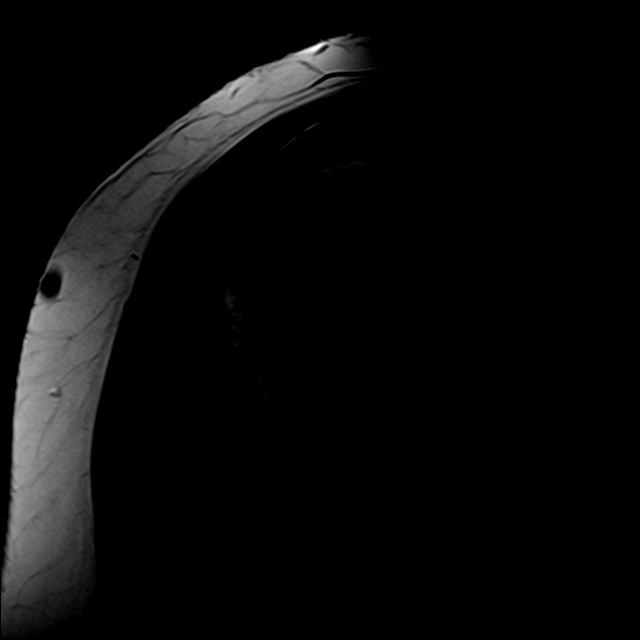
[im 9/17]
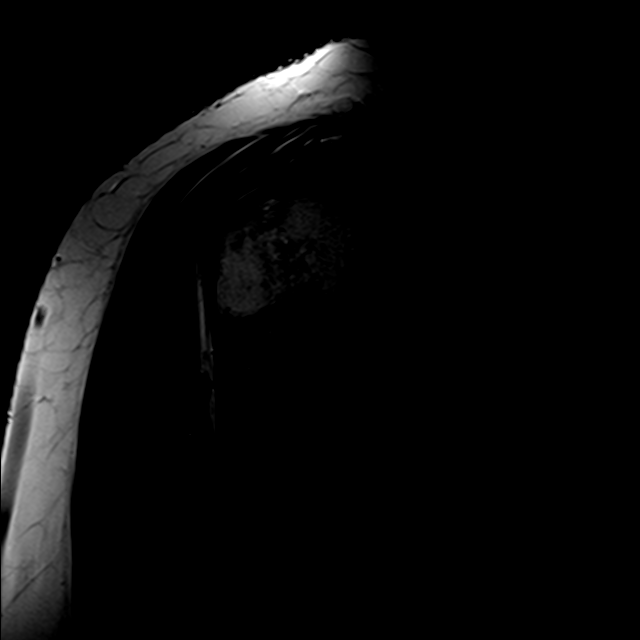
[im 11/17]
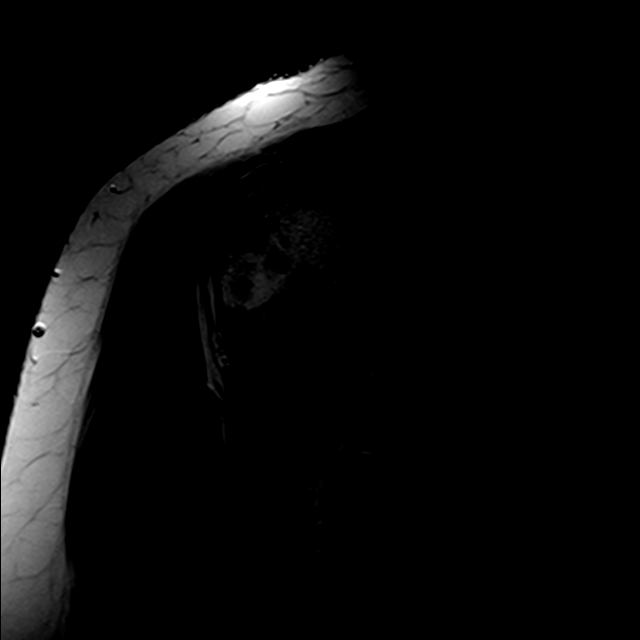
[im 14/17]
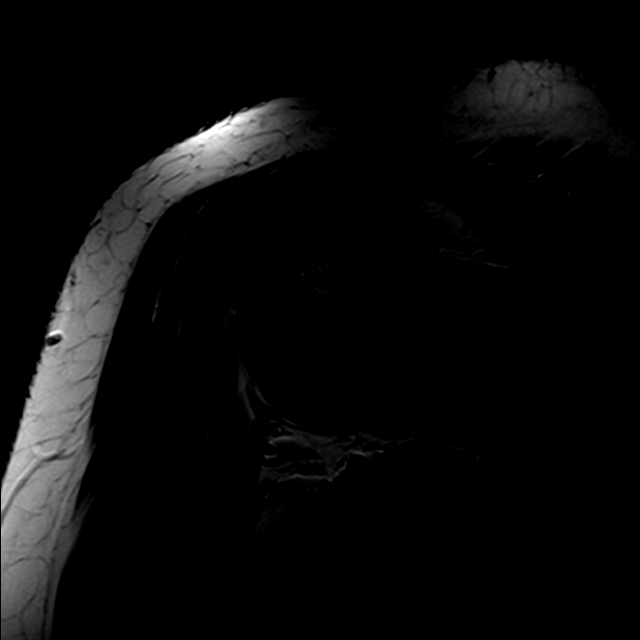
[im 17/17]
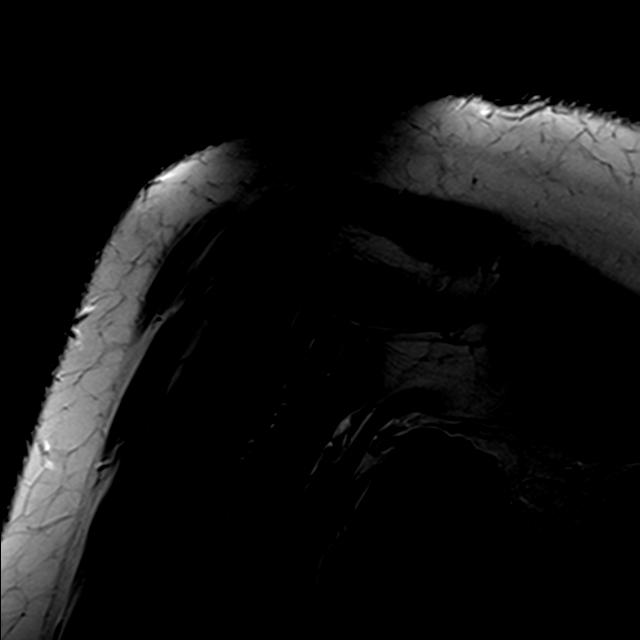

[23 of 40 positions shown; findings below may reference images not displayed]

FINDINGS: Rotator cuff: There is a full-thickness, complete insertional tear
of the supraspinatus tendon. Coronal images demonstrate
approximately 3 cm of tendon retraction. There is also tendinosis
and partial tearing of the infraspinatus and subscapularis tendons.
The teres minor tendon appears normal.

Muscles: Mild-to-moderate supraspinatus muscular atrophy. There is
also mild atrophy within the superior fibers of the infraspinatus
and subscapularis muscles.

Biceps long head: Appears slightly subluxed from the superior aspect
of the bicipital groove, although intact.

Acromioclavicular Joint: The acromion is type 2. There are mild
acromioclavicular degenerative changes. A small amount fluid is
present in the subacromial-subdeltoid bursa.

Glenohumeral Joint: No significant shoulder joint effusion or
glenohumeral arthropathy.

Labrum: No evidence of labral tear. Labral evaluation is limited by
the lack of joint fluid.

Bones: No acute or significant extra-articular osseous findings.

Other: No significant soft tissue findings.
IMPRESSION: 1. Full-thickness, complete insertional tear of the supraspinatus
tendon appears chronic with associated muscular atrophy.
2. Tendinosis and partial tearing of the superior fibers of the
subscapularis and teres minor tendons. Associated mild subluxation
of the biceps tendon from the superior aspect of the bicipital
groove.
3. The labrum appears intact.

## 2017-05-09 ENCOUNTER — Ambulatory Visit: Payer: Medicaid Other | Attending: Internal Medicine

## 2017-06-09 ENCOUNTER — Encounter: Payer: Self-pay | Admitting: Internal Medicine

## 2017-06-09 ENCOUNTER — Other Ambulatory Visit (HOSPITAL_COMMUNITY)
Admission: RE | Admit: 2017-06-09 | Discharge: 2017-06-09 | Disposition: A | Discharge status: Home / Self Care | Payer: Medicaid Other | Source: Ambulatory Visit | Attending: Internal Medicine | Admitting: Internal Medicine

## 2017-06-09 ENCOUNTER — Ambulatory Visit: Payer: Self-pay | Attending: Internal Medicine | Admitting: Internal Medicine

## 2017-06-09 VITALS — BP 162/126 | HR 65 | Temp 98.3°F | Resp 16 | Wt 218.8 lb

## 2017-06-09 DIAGNOSIS — E785 Hyperlipidemia, unspecified: Secondary | ICD-10-CM

## 2017-06-09 DIAGNOSIS — Z124 Encounter for screening for malignant neoplasm of cervix: Secondary | ICD-10-CM

## 2017-06-09 DIAGNOSIS — Z79899 Other long term (current) drug therapy: Secondary | ICD-10-CM | POA: Insufficient documentation

## 2017-06-09 DIAGNOSIS — L918 Other hypertrophic disorders of the skin: Secondary | ICD-10-CM

## 2017-06-09 DIAGNOSIS — I10 Essential (primary) hypertension: Secondary | ICD-10-CM

## 2017-06-09 DIAGNOSIS — Z880 Allergy status to penicillin: Secondary | ICD-10-CM | POA: Insufficient documentation

## 2017-06-09 DIAGNOSIS — Z6834 Body mass index (BMI) 34.0-34.9, adult: Secondary | ICD-10-CM | POA: Insufficient documentation

## 2017-06-09 DIAGNOSIS — Z803 Family history of malignant neoplasm of breast: Secondary | ICD-10-CM | POA: Insufficient documentation

## 2017-06-09 DIAGNOSIS — Z1239 Encounter for other screening for malignant neoplasm of breast: Secondary | ICD-10-CM

## 2017-06-09 DIAGNOSIS — Z1231 Encounter for screening mammogram for malignant neoplasm of breast: Secondary | ICD-10-CM

## 2017-06-09 DIAGNOSIS — Z8249 Family history of ischemic heart disease and other diseases of the circulatory system: Secondary | ICD-10-CM | POA: Insufficient documentation

## 2017-06-09 DIAGNOSIS — E669 Obesity, unspecified: Secondary | ICD-10-CM

## 2017-06-09 MED ORDER — AMLODIPINE BESYLATE 10 MG PO TABS: 10.0000 mg | ORAL_TABLET | Freq: Every day | ORAL | 3 refills | Status: DC

## 2017-06-09 MED ORDER — PRAVASTATIN SODIUM 20 MG PO TABS: 20.0000 mg | ORAL_TABLET | Freq: Every day | ORAL | 3 refills | Status: DC

## 2017-06-09 MED FILL — PRAVASTATIN SODIUM 20 MG TA: 20 | 30 days supply | Qty: 30 | Fill #0

## 2017-06-09 MED FILL — AMLODIPINE BESYLATE 10 MG T: 10 | 30 days supply | Qty: 30 | Fill #0

## 2017-06-09 NOTE — Patient Instructions (Addendum)
Please give patient an appointment with the clinical pharmacist in 2-3 weeks for recheck on blood pressure.   Cut back on salt in your foods.   Follow a Healthy Eating Plan - You can do it! Limit sugary drinks.  Avoid sodas, sweet tea, sport or energy drinks, or fruit drinks.  Drink water, lo-fat milk, or diet drinks. Limit snack foods.   Cut back on candy, cake, cookies, chips, ice cream.  These are a special treat, only in small amounts. Eat plenty of vegetables.  Especially dark green, red, and orange vegetables. Aim for at least 3 servings a day. More is better! Include fruit in your daily diet.  Whole fruit is much healthier than fruit juice! Limit "white" bread, "white" pasta, "white" rice.   Choose "100% whole grain" products, brown or wild rice. Avoid fatty meats. Try "Meatless Monday" and choose eggs or beans one day a week.  When eating meat, choose lean meats like chicken, [place]key, and fish.  Grill, broil, or bake meats instead of frying, and eat poultry without the skin. Eat less salt.  Avoid frozen pizzas, frozen dinners and salty foods.  Use seasonings other than salt in cooking.  This can help blood pressure and keep you from swelling Beer, wine and liquor have calories.  If you can safely drink alcohol, limit to 1 drink per day for women, 2 drinks for men

## 2017-06-09 NOTE — Progress Notes (Signed)
Patient ID: GRISSEL TYRELL, female    DOB: Dec 12, 1970  MRN: [id number]  CC: Hypertension   Subjective: Audrey Taylor is a 47 y.o. female who presents for chronic ds management.  Last seen 1 yr ago Her concerns today include:  Pt with hx of HTN, obesity and HL  HTN/HL:  Out of Norvasc and Pravachol x 3 mths No HA/dizziness.  Use to have chest fluttering but nothing in past 3 mths Trying to limit salt in foods  Due for PAP:  No hx of abnormal paps She is postmenopausal x 8 yrs.  Had ablation due to menorrhagia. No vaginal dischg or itching at this time Sexually active with one partner Mother with Breast CA dx in her 15's.  Maternal aunt with vaginal CA  Obesity:  Drinks mainly regular sodas, ice tea and lemonade.  Meats - eats a varity including fish, beef, chicken.  Lives on 3rd floor apartment.  She walks up and down these two flights of stairs several times a day  Patient Active Problem List   Diagnosis Date Noted  . Hyperlipidemia 04/27/2016  . Complete tear of right rotator cuff 08/28/2015  . HTN (hypertension), benign 08/13/2015  . HYPERTRIGLYCERIDEMIA 01/24/2009  . Obesity 12/24/2008  . EXOPHTHALMOS 12/24/2008  . FRACTURED TOOTH 12/24/2008     Current Outpatient Medications on File Prior to Visit  Medication Sig Dispense Refill  . amLODipine (NORVASC) 10 MG tablet Take 1 tablet (10 mg total) by mouth daily. 90 tablet 3  . pravastatin (PRAVACHOL) 20 MG tablet Take 1 tablet (20 mg total) by mouth daily. 90 tablet 3  . acetaminophen (TYLENOL) 500 MG tablet Take 500 mg by mouth at bedtime.    Marland Kitchen acetaminophen-codeine (TYLENOL #3) 300-30 MG tablet Take 1-2 tablets by mouth every 8 (eight) hours as needed for moderate pain. (Patient not taking: Reported on 05/27/2016) 45 tablet 0  . cyclobenzaprine (FLEXERIL) 10 MG tablet Take 1 tablet (10 mg total) by mouth 3 (three) times daily as needed for muscle spasms. (Patient not taking: Reported on 05/26/2016) 45 tablet 0  . ibuprofen  (ADVIL,MOTRIN) 600 MG tablet Take 1 tablet (600 mg total) by mouth every 8 (eight) hours as needed. (Patient not taking: Reported on 06/09/2017) 30 tablet 0   No current facility-administered medications on file prior to visit.     Allergies  Allergen Reactions  . Penicillins Other (See Comments)    Childhood allergy  . Penicillins Rash    Childhood allergic reaction Has patient had a PCN reaction causing immediate rash, facial/tongue/throat swelling, SOB or lightheadedness with hypotension: Yes Has patient had a PCN reaction causing severe rash involving mucus membranes or skin necrosis: No Has patient had a PCN reaction that required hospitalization No Has patient had a PCN reaction occurring within the last 10 years: No If all of the above answers are "NO", then may proceed with Cephalosporin use.    Social History   Socioeconomic History  . Marital status: Married    Spouse name: Not on file  . Number of children: Not on file  . Years of education: Not on file  . Highest education level: Not on file  Occupational History  . Not on file  Social Needs  . Financial resource strain: Not on file  . Food insecurity:    Worry: Not on file    Inability: Not on file  . Transportation needs:    Medical: Not on file    Non-medical: Not on file  Tobacco Use  . Smoking status: Never Smoker  . Smokeless tobacco: Never Used  Substance and Sexual Activity  . Alcohol use: No  . Drug use: No  . Sexual activity: Yes    Birth control/protection: Surgical  Lifestyle  . Physical activity:    Days per week: Not on file    Minutes per session: Not on file  . Stress: Not on file  Relationships  . Social connections:    Talks on phone: Not on file    Gets together: Not on file    Attends religious service: Not on file    Active member of club or organization: Not on file    Attends meetings of clubs or organizations: Not on file    Relationship status: Not on file  . Intimate partner  violence:    Fear of current or ex partner: Not on file    Emotionally abused: Not on file    Physically abused: Not on file    Forced sexual activity: Not on file  Other Topics Concern  . Not on file  Social History Narrative   ** Merged History Encounter **        Family History  Problem Relation Age of Onset  . Breast cancer Mother   . Hypertension Father     Past Surgical History:  Procedure Laterality Date  . ABLATION    . ENDOMETRIAL ABLATION  2010  . I&D EXTREMITY  05/28/2011   Procedure: IRRIGATION AND DEBRIDEMENT EXTREMITY;  Surgeon: Marlowe ShoresMatthew A Weingold, MD;  Location: [facility] OR;  Service: Orthopedics;  Laterality: Right;  . ROTATOR CUFF REPAIR    . SHOULDER ACROMIOPLASTY Right 08/28/2015   Procedure: SHOULDER ACROMIOPLASTY;  Surgeon: Teryl LucyJoshua Landau, MD;  Location: [facility];  Service: Orthopedics;  Laterality: Right;  . SHOULDER ARTHROSCOPY WITH ROTATOR CUFF REPAIR Right 08/28/2015   Procedure: RIGHT SHOULDER ARTHROSCOPY WITH DEBRIDEMENT, ACROMIOPLASTY, ROTATOR CUFF REPAIR;  Surgeon: Teryl LucyJoshua Landau, MD;  Location: [facility];  Service: Orthopedics;  Laterality: Right;  . TUBAL LIGATION      ROS: Review of Systems Neg except as stated above  PHYSICAL EXAM: BP (!) 162/126   Pulse 65   Temp 98.3 F (36.8 C) (Oral)   Resp 16   Wt 218 lb 12.8 oz (99.2 kg)   SpO2 97%   BMI 34.27 kg/m   Wt Readings from Last 3 Encounters:  06/09/17 218 lb 12.8 oz (99.2 kg)  08/16/16 216 lb (98 kg)  05/27/16 207 lb 3.2 oz (94 kg)    Physical Exam  General appearance - alert, well appearing, and in no distress Mental status - normal mood, behavior, speech, dress, motor activity, and thought processes Eyes - pupils equal and reactive, extraocular eye movements intact Nose - normal and patent, no erythema, discharge or polyps Mouth - mucous membranes moist, pharynx normal without lesions Neck - supple, no significant adenopathy Chest - clear to  auscultation, no wheezes, rales or rhonchi, symmetric air entry Heart - normal rate, regular rhythm, normal S1, S2, no murmurs, rubs, clicks or gallops Abdomen - obese, soft, nontender, nondistended, no masses or organomegaly Breasts - breasts appear normal, no suspicious masses, no skin or nipple changes or axillary nodes Pelvic - normal external genitalia, vulva, vagina, cervix, uterus and adnexa.  Small amount of off white dischg around cervix Extremities - peripheral pulses normal, no pedal edema, no clubbing or cyanosis Skin -multiple skin tags over neck; less on face  ASSESSMENT AND PLAN: 1.  Essential hypertension Not at goal.  RF Norvasc.  Will have her f/u with clinical pharmacist preferably in 1 wk but pt wants to wait and come same day as her husband's appt the 3rd wk of this mth.   On visit with pharmacist, if BP still >130/80, I would add  HCTZ 25 mg -DASH diet discussed and encouraged - amLODipine (NORVASC) 10 MG tablet; Take 1 tablet (10 mg total) by mouth daily.  Dispense: 90 tablet; Refill: 3 - Comprehensive metabolic panel - CBC - Lipid panel  2. Hyperlipidemia, unspecified hyperlipidemia type - pravastatin (PRAVACHOL) 20 MG tablet; Take 1 tablet (20 mg total) by mouth daily.  Dispense: 90 tablet; Refill: 3  3. Obesity (BMI 30.0-34.9) Discussed healthy eating habits.  Advised about calories in regular sodas, ice tea and lemonade.  Advised to drink more water, cut back on white carbs, incorporate more fruits, veggies in det -stay active  4. Pap smear for cervical cancer screening - Cytology - PAP  5. Breast cancer screening - MM Digital Screening; Future  6. Skin tags, multiple acquired    Patient was given the opportunity to ask questions.  Patient verbalized understanding of the plan and was able to repeat key elements of the plan.   No orders of the defined types were placed in this encounter.    Requested Prescriptions    No prescriptions requested or  ordered in this encounter    No follow-ups on file.  Jonah Blue, MD, FACP

## 2017-06-10 LAB — COMPREHENSIVE METABOLIC PANEL
A/G RATIO: 1.5 (ref 1.2–2.2)
ALBUMIN: 4.3 g/dL (ref 3.5–5.5)
ALK PHOS: 134 IU/L — AB (ref 39–117)
ALT: 39 IU/L — ABNORMAL HIGH (ref 0–32)
AST: 23 IU/L (ref 0–40)
BILIRUBIN TOTAL: 0.3 mg/dL (ref 0.0–1.2)
BUN / CREAT RATIO: 9 (ref 9–23)
BUN: 8 mg/dL (ref 6–24)
CHLORIDE: 107 mmol/L — AB (ref 96–106)
CO2: 21 mmol/L (ref 20–29)
Calcium: 9.4 mg/dL (ref 8.7–10.2)
Creatinine, Ser: 0.93 mg/dL (ref 0.57–1.00)
GFR calc non Af Amer: 73 mL/min/{1.73_m2} (ref >59)
GFR, EST AFRICAN AMERICAN: 84 mL/min/{1.73_m2} (ref >59)
GLOBULIN, TOTAL: 2.9 g/dL (ref 1.5–4.5)
Glucose: 87 mg/dL (ref 65–99)
POTASSIUM: 4.5 mmol/L (ref 3.5–5.2)
SODIUM: 143 mmol/L (ref 134–144)
TOTAL PROTEIN: 7.2 g/dL (ref 6.0–8.5)

## 2017-06-10 LAB — LIPID PANEL
CHOLESTEROL TOTAL: 240 mg/dL — AB (ref 100–199)
Chol/HDL Ratio: 5.1 ratio — ABNORMAL HIGH (ref 0.0–4.4)
HDL: 47 mg/dL (ref >39)
LDL Calculated: 175 mg/dL — ABNORMAL HIGH (ref 0–99)
Triglycerides: 90 mg/dL (ref 0–149)
VLDL CHOLESTEROL CAL: 18 mg/dL (ref 5–40)

## 2017-06-10 LAB — CBC
HEMATOCRIT: 44.3 % (ref 34.0–46.6)
Hemoglobin: 13.8 g/dL (ref 11.1–15.9)
MCH: 27.4 pg (ref 26.6–33.0)
MCHC: 31.2 g/dL — AB (ref 31.5–35.7)
MCV: 88 fL (ref 79–97)
PLATELETS: 277 10*3/uL (ref 150–450)
RBC: 5.04 x10E6/uL (ref 3.77–5.28)
RDW: 16.2 % — AB (ref 12.3–15.4)
WBC: 6.1 10*3/uL (ref 3.4–10.8)

## 2017-06-13 ENCOUNTER — Other Ambulatory Visit: Payer: Self-pay | Admitting: Internal Medicine

## 2017-06-13 LAB — CYTOLOGY - PAP
DIAGNOSIS: NEGATIVE
HPV: NOT DETECTED

## 2017-06-13 MED ORDER — FLUCONAZOLE 150 MG PO TABS: 150.0000 mg | ORAL_TABLET | Freq: Once | ORAL | 0 refills | Status: AC

## 2017-06-13 MED FILL — FLUCONAZOLE 150 MG TABS: 150 | 1 days supply | Qty: 1 | Fill #0

## 2017-06-15 ENCOUNTER — Telehealth: Payer: Self-pay

## 2017-06-15 NOTE — Telephone Encounter (Signed)
Contacted pt to go over lab and pap results pt is aware and doesn't have any questions or concerns

## 2017-06-22 NOTE — Progress Notes (Deleted)
   S:    Patient arrives ***.    Presents to the clinic for hypertension evaluation, counseling, and management. Patient was referred by Dr. Laural BenesJohnson on 06/09/17.  Patient was last seen by Primary Care Provider on 06/09/17.   Patient {Actions; denies-reports:120008} adherence with medications.  Current BP Medications include:   - amlodipine 10 mg daily  Antihypertensives tried in the past include: ***  Dietary habits include: *** Exercise habits include:*** Family / Social history: ***  ASCVD risk factors include:***  Home BP readings: ***   .[email or web address]   O:  Physical Exam   ROS  Last 3 Office BP readings: BP Readings from Last 3 Encounters:  06/09/17 (!) 162/126  08/16/16 129/89  05/27/16 116/80    BMET    Component Value Date/Time   NA 143 06/09/2017 1054   K 4.5 06/09/2017 1054   CL 107 (H) 06/09/2017 1054   CO2 21 06/09/2017 1054   GLUCOSE 87 06/09/2017 1054   GLUCOSE 125 (H) 04/18/2016 2023   BUN 8 06/09/2017 1054   CREATININE 0.93 06/09/2017 1054   CREATININE 0.84 04/03/2015 1000   CALCIUM 9.4 06/09/2017 1054   GFRNONAA 73 06/09/2017 1054   GFRNONAA 78 05/29/2013 0957   GFRAA 84 06/09/2017 1054   GFRAA >89 05/29/2013 0957    Renal function: CrCl cannot be calculated (Unknown ideal weight.).  A/P: Hypertension longstanding/newly diagnosed currently *** on current medications. BP Goal = *** mmHg. Patient {Is/is not:9024} adherent with current medications.  -{Meds adjust:18428} ***. HCTZ 25 mg daily -F/u labs ordered - *** -Counseled on lifestyle modifications for blood pressure control including reduced dietary sodium, increased exercise, adequate sleep  Results reviewed and written information provided.   Total time in face-to-face counseling *** minutes.   F/U Clinic Visit in ***.  Patient seen with ***  Butch PennyLuke Van Ausdall, PharmD, CPP Clinical Pharmacist [facility] [phone]

## 2017-06-23 ENCOUNTER — Encounter: Payer: Medicaid Other | Admitting: Pharmacist

## 2017-06-30 ENCOUNTER — Encounter: Payer: Self-pay | Admitting: Pharmacist

## 2017-06-30 ENCOUNTER — Ambulatory Visit: Payer: Self-pay | Attending: Internal Medicine | Admitting: Pharmacist

## 2017-06-30 ENCOUNTER — Encounter: Payer: Medicaid Other | Admitting: Pharmacist

## 2017-06-30 VITALS — BP 114/74 | HR 81

## 2017-06-30 DIAGNOSIS — Z8249 Family history of ischemic heart disease and other diseases of the circulatory system: Secondary | ICD-10-CM | POA: Insufficient documentation

## 2017-06-30 DIAGNOSIS — I10 Essential (primary) hypertension: Secondary | ICD-10-CM | POA: Insufficient documentation

## 2017-06-30 DIAGNOSIS — Z79899 Other long term (current) drug therapy: Secondary | ICD-10-CM | POA: Insufficient documentation

## 2017-06-30 NOTE — Progress Notes (Signed)
   S:    PCP: Dr. Laural BenesJohnson  Patient arrives in good spirits. Presents to the clinic for hypertension evaluation, counseling, and management. Patient was referred by Dr. Laural BenesJohnson on 06/09/17.  Patient was last seen by Primary Care Provider on 06/09/17. Of note, Dr. Laural BenesJohnson left standing orders for HCTZ 25 mg if BP elevated.   Patient reports adherence with medications.  Current BP Medications include:   - amlodipine 10 mg daily  Antihypertensives tried in the past include:  - losartan 25 mg daily  Dietary habits include:  - Patient states still struggling to reduce dietary sodium. States that she is working on it.  - Does not consume caffiene Exercise habits include:  - Patient states she knows she needs to increase her physical activity. She states getting some but not enough   Family / Social history:  - Never smoker - Drinks 1 glass of wine or wine cooler "once in a blue moon" - Father: HTN  Home BP readings:  - Patient does not take BP at home   O:  Physical Exam   ROS   BP at today's visit: 114/74 taken in L arm after 5 minutes rest  Last 3 Office BP readings: BP Readings from Last 3 Encounters:  06/09/17 (!) 162/126  08/16/16 129/89  05/27/16 116/80    BMET    Component Value Date/Time   NA 143 06/09/2017 1054   K 4.5 06/09/2017 1054   CL 107 (H) 06/09/2017 1054   CO2 21 06/09/2017 1054   GLUCOSE 87 06/09/2017 1054   GLUCOSE 125 (H) 04/18/2016 2023   BUN 8 06/09/2017 1054   CREATININE 0.93 06/09/2017 1054   CREATININE 0.84 04/03/2015 1000   CALCIUM 9.4 06/09/2017 1054   GFRNONAA 73 06/09/2017 1054   GFRNONAA 78 05/29/2013 0957   GFRAA 84 06/09/2017 1054   GFRAA >89 05/29/2013 0957    Renal function: CrCl cannot be calculated (Unknown ideal weight.).  A/P: Hypertension longstanding controlled on current medications. BP Goal <130/80 mmHg. Patient is adherent with amlodipine. She had been without BP medications for some time before last PCP visit. Pressure  was elevated at that visit. Since re-starting amlodipine pressure appears controlled. Advised patient to keep contact with pharmacy and let us know when she's running low so we can get refills for her.  -Continued amlodipine 10 mg daily.  -BMP resulted 06/09/17. Renal function and electrolytes within normal limits. -Counseled on lifestyle modifications for blood pressure control including reduced dietary sodium, increased exercise, adequate sleep  Results reviewed and written information provided.   Total time in face-to-face counseling 15 minutes.   F/U Clinic Visit 07/21/17.   Butch PennyLuke Van Ausdall, PharmD, CPP Clinical Pharmacist [facility] [phone]

## 2017-06-30 NOTE — Patient Instructions (Signed)
Thank you for coming to see me today. Blood pressure is at goal so we will make no modifications at this time.   Please remember to take all of your medications and follow-up with Dr. Laural BenesJohnson next month.   Continue trying to make healthy diet choices and increase physical exercise as you're able.

## 2017-07-14 ENCOUNTER — Ambulatory Visit: Payer: Medicaid Other

## 2017-07-21 ENCOUNTER — Encounter: Payer: Self-pay | Admitting: Internal Medicine

## 2017-07-21 ENCOUNTER — Ambulatory Visit: Payer: Self-pay | Attending: Internal Medicine | Admitting: Internal Medicine

## 2017-07-21 VITALS — BP 125/86 | HR 72 | Temp 98.6°F | Resp 16 | Wt 211.6 lb

## 2017-07-21 DIAGNOSIS — Z79899 Other long term (current) drug therapy: Secondary | ICD-10-CM | POA: Insufficient documentation

## 2017-07-21 DIAGNOSIS — E669 Obesity, unspecified: Secondary | ICD-10-CM | POA: Insufficient documentation

## 2017-07-21 DIAGNOSIS — Z880 Allergy status to penicillin: Secondary | ICD-10-CM | POA: Insufficient documentation

## 2017-07-21 DIAGNOSIS — I10 Essential (primary) hypertension: Secondary | ICD-10-CM | POA: Insufficient documentation

## 2017-07-21 DIAGNOSIS — Z8249 Family history of ischemic heart disease and other diseases of the circulatory system: Secondary | ICD-10-CM | POA: Insufficient documentation

## 2017-07-21 DIAGNOSIS — Z6833 Body mass index (BMI) 33.0-33.9, adult: Secondary | ICD-10-CM | POA: Insufficient documentation

## 2017-07-21 DIAGNOSIS — R945 Abnormal results of liver function studies: Secondary | ICD-10-CM | POA: Insufficient documentation

## 2017-07-21 DIAGNOSIS — E785 Hyperlipidemia, unspecified: Secondary | ICD-10-CM | POA: Insufficient documentation

## 2017-07-21 DIAGNOSIS — N393 Stress incontinence (female) (male): Secondary | ICD-10-CM | POA: Insufficient documentation

## 2017-07-21 DIAGNOSIS — R7989 Other specified abnormal findings of blood chemistry: Secondary | ICD-10-CM | POA: Insufficient documentation

## 2017-07-21 DIAGNOSIS — L299 Pruritus, unspecified: Secondary | ICD-10-CM | POA: Insufficient documentation

## 2017-07-21 MED ORDER — TRIAMCINOLONE ACETONIDE 0.1 % EX CREA: 1.0000 "application " | TOPICAL_CREAM | Freq: Two times a day (BID) | CUTANEOUS | 0 refills | Status: DC

## 2017-07-21 MED FILL — AMLODIPINE BESYLATE 10 MG T: 10 | 30 days supply | Qty: 30 | Fill #1

## 2017-07-21 MED FILL — PRAVASTATIN SODIUM 20 MG TA: 20 | 30 days supply | Qty: 30 | Fill #1

## 2017-07-21 NOTE — Progress Notes (Signed)
Patient ID: Audrey Taylor, female    DOB: 11/14/1970  MRN: [id number]  CC: Hypertension   Subjective: Audrey Taylor is a 47 y.o. female who presents for follow-up visit. Her concerns today include:  Pt with hx of HTN, obesity and HL  HTN: Patient seen about 6 weeks ago.  Blood pressure was not at goal at the time.  Refill was given on Norvasc.  She did follow-up with the clinical pharmacist and blood pressure on that visit was good. -I went over lab results from her last visit.  She had mild elevation in ALT.  She is on pravastatin.  Her LDL cholesterol was 175 with goal being less than 100.  She is now taking Pravachol consistently.  Complains of leakage of urine when she coughs.  She has had this for years since giving birth to children.  It is not gotten any worse.  She wears panty liners when she is going to be away from the house.  However she noticed a little itching when she wears panty liners.  Complains of intermittent itching on the radial surface of the left wrist x2 months.  No rash just itching.  She usually sprays perfume on her wrists.  Patient Active Problem List   Diagnosis Date Noted  . Essential hypertension 06/09/2017  . Obesity (BMI 30.0-34.9) 06/09/2017  . Skin tags, multiple acquired 06/09/2017  . Hyperlipidemia 04/27/2016  . Complete tear of right rotator cuff 08/28/2015  . Obesity 12/24/2008  . EXOPHTHALMOS 12/24/2008  . FRACTURED TOOTH 12/24/2008     Current Outpatient Medications on File Prior to Visit  Medication Sig Dispense Refill  . amLODipine (NORVASC) 10 MG tablet Take 1 tablet (10 mg total) by mouth daily. 90 tablet 3  . cyclobenzaprine (FLEXERIL) 10 MG tablet Take 1 tablet (10 mg total) by mouth 3 (three) times daily as needed for muscle spasms. (Patient not taking: Reported on 05/26/2016) 45 tablet 0  . pravastatin (PRAVACHOL) 20 MG tablet Take 1 tablet (20 mg total) by mouth daily. 90 tablet 3   No current facility-administered medications on  file prior to visit.     Allergies  Allergen Reactions  . Penicillins Other (See Comments)    Childhood allergy  . Penicillins Rash    Childhood allergic reaction Has patient had a PCN reaction causing immediate rash, facial/tongue/throat swelling, SOB or lightheadedness with hypotension: Yes Has patient had a PCN reaction causing severe rash involving mucus membranes or skin necrosis: No Has patient had a PCN reaction that required hospitalization No Has patient had a PCN reaction occurring within the last 10 years: No If all of the above answers are "NO", then may proceed with Cephalosporin use.    Social History   Socioeconomic History  . Marital status: Married    Spouse name: Not on file  . Number of children: Not on file  . Years of education: Not on file  . Highest education level: Not on file  Occupational History  . Not on file  Social Needs  . Financial resource strain: Not on file  . Food insecurity:    Worry: Not on file    Inability: Not on file  . Transportation needs:    Medical: Not on file    Non-medical: Not on file  Tobacco Use  . Smoking status: Never Smoker  . Smokeless tobacco: Never Used  Substance and Sexual Activity  . Alcohol use: No  . Drug use: No  . Sexual activity: Yes  Birth control/protection: Surgical  Lifestyle  . Physical activity:    Days per week: Not on file    Minutes per session: Not on file  . Stress: Not on file  Relationships  . Social connections:    Talks on phone: Not on file    Gets together: Not on file    Attends religious service: Not on file    Active member of club or organization: Not on file    Attends meetings of clubs or organizations: Not on file    Relationship status: Not on file  . Intimate partner violence:    Fear of current or ex partner: Not on file    Emotionally abused: Not on file    Physically abused: Not on file    Forced sexual activity: Not on file  Other Topics Concern  . Not on file   Social History Narrative   ** Merged History Encounter **        Family History  Problem Relation Age of Onset  . Breast cancer Mother   . Hypertension Father     Past Surgical History:  Procedure Laterality Date  . ABLATION    . ENDOMETRIAL ABLATION  2010  . I&D EXTREMITY  05/28/2011   Procedure: IRRIGATION AND DEBRIDEMENT EXTREMITY;  Surgeon: Marlowe ShoresMatthew A Weingold, MD;  Location: [facility] OR;  Service: Orthopedics;  Laterality: Right;  . ROTATOR CUFF REPAIR    . SHOULDER ACROMIOPLASTY Right 08/28/2015   Procedure: SHOULDER ACROMIOPLASTY;  Surgeon: Teryl LucyJoshua Landau, MD;  Location: [facility];  Service: Orthopedics;  Laterality: Right;  . SHOULDER ARTHROSCOPY WITH ROTATOR CUFF REPAIR Right 08/28/2015   Procedure: RIGHT SHOULDER ARTHROSCOPY WITH DEBRIDEMENT, ACROMIOPLASTY, ROTATOR CUFF REPAIR;  Surgeon: Teryl LucyJoshua Landau, MD;  Location: [facility];  Service: Orthopedics;  Laterality: Right;  . TUBAL LIGATION      ROS: Review of Systems Negative except as stated above PHYSICAL EXAM: BP 125/86   Pulse 72   Temp 98.6 F (37 C) (Oral)   Resp 16   Wt 211 lb 9.6 oz (96 kg)   SpO2 98%   BMI 33.14 kg/m   Physical Exam  General appearance - alert, well appearing, middle-aged female and in no distress Mental status - normal mood, behavior, speech, dress, motor activity, and thought processes Chest - clear to auscultation, no wheezes, rales or rhonchi, symmetric air entry Heart - normal rate, regular rhythm, normal S1, S2, no murmurs, rubs, clicks or gallops Extremities - peripheral pulses normal, no pedal edema, no clubbing or cyanosis Skin -left wrist: No rash or erythema noted at this time  Results for orders placed or performed in visit on 06/09/17  Comprehensive metabolic panel  Result Value Ref Range   Glucose 87 65 - 99 mg/dL   BUN 8 6 - 24 mg/dL   Creatinine, Ser 1.610.93 0.57 - 1.00 mg/dL   GFR calc non Af Amer 73 >59 mL/min/1.73   GFR calc Af Amer 84 >59  mL/min/1.73   BUN/Creatinine Ratio 9 9 - 23   Sodium 143 134 - 144 mmol/L   Potassium 4.5 3.5 - 5.2 mmol/L   Chloride 107 (H) 96 - 106 mmol/L   CO2 21 20 - 29 mmol/L   Calcium 9.4 8.7 - 10.2 mg/dL   Total Protein 7.2 6.0 - 8.5 g/dL   Albumin 4.3 3.5 - 5.5 g/dL   Globulin, Total 2.9 1.5 - 4.5 g/dL   Albumin/Globulin Ratio 1.5 1.2 - 2.2   Bilirubin Total 0.3  0.0 - 1.2 mg/dL   Alkaline Phosphatase 134 (H) 39 - 117 IU/L   AST 23 0 - 40 IU/L   ALT 39 (H) 0 - 32 IU/L  CBC  Result Value Ref Range   WBC 6.1 3.4 - 10.8 x10E3/uL   RBC 5.04 3.77 - 5.28 x10E6/uL   Hemoglobin 13.8 11.1 - 15.9 g/dL   Hematocrit 16.1 09.6 - 46.6 %   MCV 88 79 - 97 fL   MCH 27.4 26.6 - 33.0 pg   MCHC 31.2 (L) 31.5 - 35.7 g/dL   RDW 04.5 (H) 40.9 - 81.1 %   Platelets 277 150 - 450 x10E3/uL  Lipid panel  Result Value Ref Range   Cholesterol, Total 240 (H) 100 - 199 mg/dL   Triglycerides 90 0 - 149 mg/dL   HDL 47 >91 mg/dL   VLDL Cholesterol Cal 18 5 - 40 mg/dL   LDL Calculated 478 (H) 0 - 99 mg/dL   Chol/HDL Ratio 5.1 (H) 0.0 - 4.4 ratio  Cytology - PAP  Result Value Ref Range   Adequacy      Satisfactory for evaluation  endocervical/transformation zone component PRESENT.   Diagnosis      NEGATIVE FOR INTRAEPITHELIAL LESIONS OR MALIGNANCY.   Diagnosis      FUNGAL ORGANISMS PRESENT CONSISTENT WITH CANDIDA SPP.   HPV NOT DETECTED    Material Submitted CervicoVaginal Pap [ThinPrep Imaged]    CYTOLOGY - PAP PAP RESULT     ASSESSMENT AND PLAN: 1. Essential hypertension Continue Norvasc.  Continue DASH diet.  Encourage regular exercise.  2. Abnormal LFTs Not unusual to see mild elevation in AST or ALT or both with statin.  No need to stop therapy at this time.  Continue Pravachol  3. Stress incontinence Discussed diagnosis and management with patient. Recommended doing Kegal's exercises several times a day.  If no improvement or any worsening of symptoms we can refer to a uro-gynecologist to consider  surgical options  4. Itchy skin Advised patient to pay attention to see whether she develops itching after using a particular 1 of her perfumes.  If it is associated with a particular one she should discontinue using that one. - triamcinolone cream (KENALOG) 0.1 %; Apply 1 application topically 2 (two) times daily.  Dispense: 30 g; Refill: 0  Patient was given the opportunity to ask questions.  Patient verbalized understanding of the plan and was able to repeat key elements of the plan.   No orders of the defined types were placed in this encounter.    Requested Prescriptions   Signed Prescriptions Disp Refills  . triamcinolone cream (KENALOG) 0.1 % 30 g 0    Sig: Apply 1 application topically 2 (two) times daily.    Return in about 4 months (around 11/21/2017).  Jonah Blue, MD, FACP

## 2017-07-22 MED FILL — TRIAMCINOLONE 0.1% CREAM: 0.1 | 15 days supply | Qty: 30 | Fill #0

## 2017-07-28 ENCOUNTER — Ambulatory Visit: Payer: Medicaid Other | Admitting: Internal Medicine

## 2017-07-29 ENCOUNTER — Other Ambulatory Visit: Payer: Self-pay | Admitting: Obstetrics and Gynecology

## 2017-07-29 DIAGNOSIS — Z1231 Encounter for screening mammogram for malignant neoplasm of breast: Secondary | ICD-10-CM

## 2017-08-23 MED FILL — AMLODIPINE BESYLATE 10 MG T: 10 | 30 days supply | Qty: 30 | Fill #2

## 2017-08-23 MED FILL — PRAVASTATIN SODIUM 20 MG TA: 20 | 30 days supply | Qty: 30 | Fill #2

## 2017-08-25 ENCOUNTER — Ambulatory Visit (HOSPITAL_COMMUNITY)
Admission: RE | Admit: 2017-08-25 | Discharge: 2017-08-25 | Disposition: A | Discharge status: Home / Self Care | Payer: Medicaid Other | Source: Ambulatory Visit | Attending: Obstetrics and Gynecology | Admitting: Obstetrics and Gynecology

## 2017-08-25 ENCOUNTER — Encounter (HOSPITAL_COMMUNITY): Payer: Self-pay

## 2017-08-25 ENCOUNTER — Ambulatory Visit
Admission: RE | Admit: 2017-08-25 | Discharge: 2017-08-25 | Disposition: A | Discharge status: Home / Self Care | Payer: Self-pay | Source: Ambulatory Visit | Attending: Obstetrics and Gynecology | Admitting: Obstetrics and Gynecology

## 2017-08-25 VITALS — BP 118/78 | Ht 67.0 in | Wt 212.0 lb

## 2017-08-25 DIAGNOSIS — Z1239 Encounter for other screening for malignant neoplasm of breast: Secondary | ICD-10-CM

## 2017-08-25 DIAGNOSIS — Z1231 Encounter for screening mammogram for malignant neoplasm of breast: Secondary | ICD-10-CM

## 2017-08-25 NOTE — Progress Notes (Signed)
No complaints today.   Pap Smear: Pap smear not completed today. Last Pap [facility] was 06/09/2017 at [facility] and normal with negative HPV. Per patient has a history of an abnormal Pap smear 25 years ago that a repeat Pap smear was completed for follow-up. Per patient all Pap smears have been normal since repeat Pap smear and has had more than three Pap smears. Last two Pap smear results are in Epic.  Physical exam: Breasts Breasts symmetrical. No skin abnormalities bilateral breasts. No nipple retraction bilateral breasts. No nipple discharge bilateral breasts. No lymphadenopathy. No lumps palpated bilateral breasts. No complaints of pain or tenderness on exam. [facility]. Appointment scheduled for Thursday, August 25, 2017 at 1240.        Pelvic/Bimanual No Pap smear completed today since last Pap smear and HPV typing was 06/09/2017. Pap smear not indicated per BCCCP guidelines.   Smoking History: Patient has never smoked.  Patient Navigation: Patient education provided. Access to services provided for patient through BCCCP program.   Breast and Cervical Cancer Risk Assessment: Patient has a family history of her mother having breast cancer. Patient has no known genetic mutations or history of radiation treatment to the chest before age 47. Patient has no history of cervical dysplasia, immunocompromised, or DES exposure in-utero.  Risk Assessment    Risk Scores      08/25/2017   Last edited by: Lynnell DikeHolland, Sabrina H, LPN   5-year risk: 1.7 %   Lifetime risk: 13.9 %

## 2017-08-25 NOTE — Patient Instructions (Signed)
Explained breast self awareness with Audrey Taylor. Patient did not need a Pap smear today due to last Pap smear and HPV typing was 06/09/2017. Let her know BCCCP will cover Pap smears and HPV typing every 5 years unless has a history of abnormal Pap smears. [facility]. Appointment scheduled for Thursday, August 25, 2017 at 1240. Patient aware of appointment and will be there. Let patient know the Breast Center will follow up with her within the next couple weeks with results of mammogram by letter or phone. Audrey Taylor verbalized understanding.  Audrey Taylor, Audrey Maserhristine Poll, RN 12:47 PM

## 2017-08-29 ENCOUNTER — Other Ambulatory Visit (HOSPITAL_COMMUNITY): Payer: Self-pay | Admitting: *Deleted

## 2017-08-29 DIAGNOSIS — Z0000 Encounter for general adult medical examination without abnormal findings: Secondary | ICD-10-CM

## 2017-08-31 ENCOUNTER — Inpatient Hospital Stay: Payer: Self-pay

## 2017-08-31 ENCOUNTER — Ambulatory Visit: Payer: PRIVATE HEALTH INSURANCE | Attending: Obstetrics and Gynecology

## 2017-08-31 ENCOUNTER — Inpatient Hospital Stay: Payer: Self-pay | Attending: Obstetrics and Gynecology | Admitting: *Deleted

## 2017-08-31 VITALS — BP 120/70 | Ht 65.0 in | Wt 209.0 lb

## 2017-08-31 DIAGNOSIS — Z0000 Encounter for general adult medical examination without abnormal findings: Secondary | ICD-10-CM

## 2017-08-31 LAB — LIPID PANEL
Cholesterol: 219 mg/dL — ABNORMAL HIGH (ref 0–200)
HDL: 49 mg/dL
LDL Cholesterol: 146 mg/dL — ABNORMAL HIGH (ref 0–99)
Total CHOL/HDL Ratio: 4.5 ratio
Triglycerides: 121 mg/dL
VLDL: 24 mg/dL (ref 0–40)

## 2017-08-31 LAB — HEMOGLOBIN A1C
HEMOGLOBIN A1C: 5.6 % (ref 4.8–5.6)
MEAN PLASMA GLUCOSE: 114.02 mg/dL

## 2017-08-31 NOTE — Progress Notes (Signed)
[facility] initial screening  Clinical Measurement: 209lb.  Height: 65in.  Weight: Blood Pressure: 117/82  Blood Pressure #2: 120/70  Fasting Labs Drawn Today, will review with patient when they result.  Medical History:  Patient states that she has  been diagnosed with high cholesterol and high blood pressure. Patient state that she has been diagnosed with diabetes or heart disease.  Medications:  Patients states she is taking medications for high cholesterol and high blood pressure. Patient states she  does not take medication for diabetes.  Patient states she  is  taking aspirin daily to prevent heart attack or stroke.    Blood pressure, self measurement:  Patients states she does not measure blood pressure at home.    Nutrition:  Patient states she eats less than 0.5 cups of fruit and 2 cups of vegetables in an average day.  Patient states she does not eat fish regularly, she eats less than half a serving of whole grains daily. She drinks more than 36 ounces of beverages with added sugar weekly.  She is currently not  watching her sodium intake.  She has not had any drinks containing alcohol in the last seven days.  Physical activity:  Patient states that she gets 0 minutes of moderate exercise in a week.  She gets 0 minutes of vigorous exercise per week.     Smoking status:  Patient states she has never smoked and is not around any smokers.    Quality of life:  Patient states that she has had 0 bad physical days out of the last 30 days. In the last 2 weeks, she has had 0 days that she has felt down or depressed. She has had 0 days in the last 2 weeks that she has had little interest or pleasure in doing things.  Risk reduction and counseling:  Patient states she wants to lose weight and increase fruit and vegetable intake.  I encouraged her to continue with current exercise regimen and increase vegetable and fruit intake.  Navigation:  I will notify patient of lab results.  Patient is aware  of 2 more health coaching sessions and a follow up.

## 2017-08-31 NOTE — Addendum Note (Signed)
Addended by: Alvina FilbertHICKERSON, Joell Usman L on: 08/31/2017 11:47 AM   Modules accepted: Orders

## 2017-09-02 ENCOUNTER — Telehealth (HOSPITAL_COMMUNITY): Payer: Self-pay | Admitting: *Deleted

## 2017-09-02 NOTE — Telephone Encounter (Signed)
Health coaching 2  Labs- cholesterol 219,  LDL 146,  triglycerides 121, HDL 49, hemoglobin A1C 5.6, mean plasma glucose 114.02  Patient is aware and understands her lab results.   Goals- Patient states she wants to lose weight.  I encouraged her to increase fruit, vegetable and water consumption. I encouraged her to eat lean meats, fish (salmon, mackerel and tuna). Patient states she has started walking in the evenings.  I encouraged her to walk at least 150 minutes weekly.  Navigation-  Patient is aware of 1 more health coaching sessions and a follow up.  Time- 10 minutes

## 2017-09-29 MED FILL — AMLODIPINE BESYLATE 10 MG T: 10 | 30 days supply | Qty: 30 | Fill #3

## 2017-09-29 MED FILL — PRAVASTATIN SODIUM 20 MG TA: 20 | 30 days supply | Qty: 30 | Fill #3

## 2017-11-02 MED FILL — AMLODIPINE BESYLATE 10 MG T: 10 | 30 days supply | Qty: 30 | Fill #4

## 2017-11-02 MED FILL — PRAVASTATIN SODIUM 20 MG TA: 20 | 30 days supply | Qty: 30 | Fill #4

## 2017-12-03 ENCOUNTER — Telehealth: Payer: Self-pay

## 2017-12-03 NOTE — Telephone Encounter (Signed)
Health Coaching 3  Current:  Patient states that she is eating 1 serving of fruit approximately every two weeks.  Patient states that she is eating 2 servings of vegetables per day (broccoli, okra, green beans).  Patient states that she is eating 3 servings of whole grains per week. Patient states that she is eating fish 4 times per week (baked salmon, canned tuna in water and oil, and fried fish).  Patient states that she has decreased her soda intake and she is drinking more juice and water.  Patient states that for physical activity she has stairs at her apartment complex that she uses.  Patient states that she has some exercise equipment in her apartment that she uses occasionally (bicycle, stretch bands, weights, step).  New Goal:    Goal #1: Patient states that her goal is to lose 15 pounds by Jun 04, 2018.  Patient states that she is planning to do this by increasing her exercise.  Barrier(s) to reaching goal:  Patient states that a lack of discipline may be a barrier to her reaching her goal.  Strategies to overcome barrier(s):  Patient states that her strategy to overcome this barrier is to create an exercise chart which will show the times she is planning to exercise.  Patient states that she is going to post her exercise chart in her bedroom on the wall above her desk.  Confidence Level for achieving goal (1-10):  Patient states that her confidence level for achieving her goal is 7.5.  Goal #2: Patient states that her goal is to make a Do's & Don't's List for healthy foods and non-healthy foods by Friday, Dec. 6, 2019.  Patient states that she will post this list on her refrigerator door where she can see it on a daily basis.  Patient states that she is planning to refer to this list when she is tempted.  Barrier(s) to reaching goal:  Patient states that there are no barriers to her achieving her goal of completing her Do's & Don't's List by her goal date.  Strategies to overcome  barrier(s):  N/A  Confidence Level for achieving goal (1-10):  Patient states that her confidence level for achieving her goal is 7.5.  Navigation:  Patient is aware of a follow-up session, said anytime is a good time for us to contact her.  Time:  39 minutes

## 2017-12-05 MED FILL — PRAVASTATIN SODIUM 20 MG TA: 20 | 30 days supply | Qty: 30 | Fill #5

## 2017-12-05 MED FILL — AMLODIPINE BESYLATE 10 MG T: 10 | 30 days supply | Qty: 30 | Fill #5

## 2017-12-29 MED FILL — PRAVASTATIN SODIUM 20 MG TA: 20 | 30 days supply | Qty: 30 | Fill #6

## 2017-12-29 MED FILL — AMLODIPINE BESYLATE 10 MG T: 10 | 30 days supply | Qty: 30 | Fill #6

## 2018-01-24 ENCOUNTER — Ambulatory Visit: Payer: Self-pay | Attending: Internal Medicine | Admitting: *Deleted

## 2018-01-24 DIAGNOSIS — Z111 Encounter for screening for respiratory tuberculosis: Secondary | ICD-10-CM

## 2018-01-24 NOTE — Progress Notes (Signed)
PPD Placement note Audrey Taylor, 48 y.o. female is here today for placement of PPD test Reason for PPD test: Employment Pt taken PPD test before: yes Verified in allergy area and with patient that they are not allergic to the products PPD is made of (Phenol or Tween). Yes Is patient taking any oral or IV steroid medication now or have they taken it in the last month? no Has the patient ever received the BCG vaccine?: no Has the patient been in recent contact with anyone known or suspected of having active TB disease?: no       PPD placed on left forearm on 01/24/2018.  Patient advised to return for reading within 48-72 hours.

## 2018-01-26 ENCOUNTER — Ambulatory Visit: Payer: Self-pay | Attending: Internal Medicine | Admitting: *Deleted

## 2018-01-26 DIAGNOSIS — Z111 Encounter for screening for respiratory tuberculosis: Secondary | ICD-10-CM

## 2018-01-26 LAB — TB SKIN TEST
Induration: 0 mm
TB Skin Test: NEGATIVE

## 2018-01-26 NOTE — Progress Notes (Signed)
Patient had no complaint

## 2018-01-30 MED FILL — PRAVASTATIN SODIUM 20 MG TA: 20 | 30 days supply | Qty: 30 | Fill #7

## 2018-01-30 MED FILL — AMLODIPINE BESYLATE 10 MG T: 10 | 30 days supply | Qty: 30 | Fill #7

## 2018-01-31 ENCOUNTER — Encounter: Payer: Self-pay | Admitting: Internal Medicine

## 2018-01-31 ENCOUNTER — Ambulatory Visit: Payer: Self-pay | Attending: Internal Medicine | Admitting: Internal Medicine

## 2018-01-31 VITALS — BP 136/95 | HR 80 | Temp 98.3°F | Resp 16 | Ht 65.0 in | Wt 218.8 lb

## 2018-01-31 DIAGNOSIS — E6609 Other obesity due to excess calories: Secondary | ICD-10-CM

## 2018-01-31 DIAGNOSIS — I10 Essential (primary) hypertension: Secondary | ICD-10-CM

## 2018-01-31 DIAGNOSIS — M25512 Pain in left shoulder: Secondary | ICD-10-CM

## 2018-01-31 DIAGNOSIS — Z021 Encounter for pre-employment examination: Secondary | ICD-10-CM

## 2018-01-31 DIAGNOSIS — Z6836 Body mass index (BMI) 36.0-36.9, adult: Secondary | ICD-10-CM

## 2018-01-31 DIAGNOSIS — Z23 Encounter for immunization: Secondary | ICD-10-CM

## 2018-01-31 DIAGNOSIS — G8929 Other chronic pain: Secondary | ICD-10-CM

## 2018-01-31 DIAGNOSIS — E785 Hyperlipidemia, unspecified: Secondary | ICD-10-CM

## 2018-01-31 DIAGNOSIS — M25511 Pain in right shoulder: Secondary | ICD-10-CM
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
## 2018-01-31 NOTE — Patient Instructions (Addendum)
It is okay to use Tylenol 500 mg 1 tablet 2-3 times a day as needed for shoulder pain.  Please apply for the orange card/cone discount card so that we can refer you for some physical therapy on your shoulders.  Follow a Healthy Eating Plan - You can do it! Limit sugary drinks.  Avoid sodas, sweet tea, sport or energy drinks, or fruit drinks.  Drink water, lo-fat milk, or diet drinks. Limit snack foods.   Cut back on candy, cake, cookies, chips, ice cream.  These are a special treat, only in small amounts. Eat plenty of vegetables.  Especially dark green, red, and orange vegetables. Aim for at least 3 servings a day. More is better! Include fruit in your daily diet.  Whole fruit is much healthier than fruit juice! Limit "white" bread, "white" pasta, "white" rice.   Choose "100% whole grain" products, brown or wild rice. Avoid fatty meats. Try "Meatless Monday" and choose eggs or beans one day a week.  When eating meat, choose lean meats like chicken, [place], and fish.  Grill, broil, or bake meats instead of frying, and eat poultry without the skin. Eat less salt.  Avoid frozen pizzas, frozen dinners and salty foods.  Use seasonings other than salt in cooking.  This can help blood pressure and keep you from swelling Beer, wine and liquor have calories.  If you can safely drink alcohol, limit to 1 drink per day for women, 2 drinks for men   Influenza Virus Vaccine injection (Fluarix) What is this medicine? INFLUENZA VIRUS VACCINE (in floo EN zuh VAHY ruhs vak SEEN) helps to reduce the risk of getting influenza also known as the flu. This medicine may be used for other purposes; ask your health care provider or pharmacist if you have questions. COMMON BRAND NAME(S): Fluarix, Fluzone What should I tell my health care provider before I take this medicine? They need to know if you have any of these conditions: -bleeding disorder like hemophilia -fever or infection -Guillain-Barre syndrome or other  neurological problems -immune system problems -infection with the human immunodeficiency virus (HIV) or AIDS -low blood platelet counts -multiple sclerosis -an unusual or allergic reaction to influenza virus vaccine, eggs, chicken proteins, latex, gentamicin, other medicines, foods, dyes or preservatives -pregnant or trying to get pregnant -breast-feeding How should I use this medicine? This vaccine is for injection into a muscle. It is given by a health care professional. A copy of Vaccine Information Statements will be given before each vaccination. Read this sheet carefully each time. The sheet may change frequently. Talk to your pediatrician regarding the use of this medicine in children. Special care may be needed. Overdosage: If you think you have taken too much of this medicine contact a poison control center or emergency room at once. NOTE: This medicine is only for you. Do not share this medicine with others. What if I miss a dose? This does not apply. What may interact with this medicine? -chemotherapy or radiation therapy -medicines that lower your immune system like etanercept, anakinra, infliximab, and adalimumab -medicines that treat or prevent blood clots like warfarin -phenytoin -steroid medicines like prednisone or cortisone -theophylline -vaccines This list may not describe all possible interactions. Give your health care provider a list of all the medicines, herbs, non-prescription drugs, or dietary supplements you use. Also tell them if you smoke, drink alcohol, or use illegal drugs. Some items may interact with your medicine. What should I watch for while using this medicine? Report any  side effects that do not go away within 3 days to your doctor or health care professional. Call your health care provider if any unusual symptoms occur within 6 weeks of receiving this vaccine. You may still catch the flu, but the illness is not usually as bad. You cannot get the flu  from the vaccine. The vaccine will not protect against colds or other illnesses that may cause fever. The vaccine is needed every year. What side effects may I notice from receiving this medicine? Side effects that you should report to your doctor or health care professional as soon as possible: -allergic reactions like skin rash, itching or hives, swelling of the face, lips, or tongue Side effects that usually do not require medical attention (report to your doctor or health care professional if they continue or are bothersome): -fever -headache -muscle aches and pains -pain, tenderness, redness, or swelling at site where injected -weak or tired This list may not describe all possible side effects. Call your doctor for medical advice about side effects. You may report side effects to FDA at 1-800-FDA-1088. Where should I keep my medicine? This vaccine is only given in a clinic, pharmacy, doctor's office, or other health care setting and will not be stored at home. NOTE: This sheet is a summary. It may not cover all possible information. If you have questions about this medicine, talk to your doctor, pharmacist, or health care provider.  2019 Elsevier/Gold Standard (2007-07-19 09:30:40)

## 2018-01-31 NOTE — Progress Notes (Signed)
Patient ID: Audrey Taylor, female    DOB: 1970-01-11  MRN: [id number]  CC: work physical  Subjective: Audrey Taylor is a 48 y.o. 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  She does not anticipate needing any work restrictions at this time.   HTN:  Did not take Norvasc as yet this a.m.  Compliant with med and salt restriction.  No device to check BP -Gained 9 pounds since last visit.  She attributes this to "falling off the wagon over the holidays."  She states that she has given up sodas but loves lemonade.  She also eats a lot of rice.  Admits that she has not been exercising as much since the weather has been cold.  HL:  Compliant with Pravachol.   Patient Active Problem List   Diagnosis Date Noted  . Stress incontinence 07/21/2017  . Abnormal LFTs 07/21/2017  . Essential hypertension 06/09/2017  . Obesity (BMI 30.0-34.9) 06/09/2017  . Skin tags, multiple acquired 06/09/2017  . Hyperlipidemia 04/27/2016  . Complete tear of right rotator cuff 08/28/2015  . Obesity 12/24/2008  . EXOPHTHALMOS 12/24/2008  . FRACTURED TOOTH 12/24/2008     Current Outpatient Medications on File Prior to Visit  Medication Sig Dispense Refill  . amLODipine (NORVASC) 10 MG tablet Take 1 tablet (10 mg total) by mouth daily. 90 tablet 3  . cyclobenzaprine (FLEXERIL) 10 MG tablet Take 1 tablet (10 mg total) by mouth 3 (three) times daily as needed for muscle spasms. (Patient not taking: Reported on 05/26/2016) 45 tablet 0  . pravastatin (PRAVACHOL) 20 MG tablet Take 1 tablet (20 mg total) by mouth daily. 90 tablet 3  . triamcinolone cream (KENALOG) 0.1 % Apply 1 application topically 2 (two) times daily. 30 g 0   No current facility-administered medications on file prior to visit.     Allergies  Allergen Reactions  . Penicillins Other (See Comments)    Childhood allergy  .  Penicillins Rash    Childhood allergic reaction Has patient had a PCN reaction causing immediate rash, facial/tongue/throat swelling, SOB or lightheadedness with hypotension: Yes Has patient had a PCN reaction causing severe rash involving mucus membranes or skin necrosis: No Has patient had a PCN reaction that required hospitalization No Has patient had a PCN reaction occurring within the last 10 years: No If all of the above answers are "NO", then may proceed with Cephalosporin use.    Social History   Socioeconomic History  . Marital status: Married    Spouse name: Not on file  . Number of children: Not on file  . Years of education: Not on file  . Highest education level: Not on file  Occupational History  . Not on file  Social Needs  . Financial resource strain: Not on file  . Food insecurity:    Worry: Not on file    Inability: Not on file  . Transportation needs:    Medical: Not on file    Non-medical: Not on file  Tobacco Use  . Smoking status: Never Smoker  . Smokeless tobacco: Never Used  Substance and Sexual Activity  . Alcohol use: Yes    Comment: rarely  . Drug use: No  . Sexual activity: Yes    Birth control/protection: Surgical  Lifestyle  . Physical activity:    Days per week: Not on file  Minutes per session: Not on file  . Stress: Not on file  Relationships  . Social connections:    Talks on phone: Not on file    Gets together: Not on file    Attends religious service: Not on file    Active member of club or organization: Not on file    Attends meetings of clubs or organizations: Not on file    Relationship status: Not on file  . Intimate partner violence:    Fear of current or ex partner: Not on file    Emotionally abused: Not on file    Physically abused: Not on file    Forced sexual activity: Not on file  Other Topics Concern  . Not on file  Social History Narrative   ** Merged History Encounter **        Family History  Problem  Relation Age of Onset  . Breast cancer Mother   . Hypertension Father     Past Surgical History:  Procedure Laterality Date  . ABLATION    . ENDOMETRIAL ABLATION  2010  . I&D EXTREMITY  05/28/2011   Procedure: IRRIGATION AND DEBRIDEMENT EXTREMITY;  Surgeon: Marlowe ShoresMatthew A Weingold, MD;  Location: [facility] OR;  Service: Orthopedics;  Laterality: Right;  . ROTATOR CUFF REPAIR    . SHOULDER ACROMIOPLASTY Right 08/28/2015   Procedure: SHOULDER ACROMIOPLASTY;  Surgeon: Teryl LucyJoshua Landau, MD;  Location: [facility];  Service: Orthopedics;  Laterality: Right;  . SHOULDER ARTHROSCOPY WITH ROTATOR CUFF REPAIR Right 08/28/2015   Procedure: RIGHT SHOULDER ARTHROSCOPY WITH DEBRIDEMENT, ACROMIOPLASTY, ROTATOR CUFF REPAIR;  Surgeon: Teryl LucyJoshua Landau, MD;  Location: [facility];  Service: Orthopedics;  Laterality: Right;  . TUBAL LIGATION      ROS: Review of Systems  Constitutional: Negative for fever.  HENT: Negative for congestion, hearing loss, rhinorrhea, sinus pain, sneezing and sore throat.   Eyes: Negative for visual disturbance.  Respiratory: Negative for cough and shortness of breath.   Cardiovascular: Negative for chest pain.  Gastrointestinal: Negative for abdominal pain and diarrhea.  Musculoskeletal:       Chronic pain in both shoulders since 2017.  Had rotator cuff repair RT shoulder 2017.  In 2018, suffered tendon tear LT shoulder in [place].  Referred to ortho but no insurance at the time so did not go.  Pain in both shoulders mainly at nights when she tries to lay on either side.  Also has problems with lifting more than 5 to 10 pounds above the head.  She takes Tylenol PM as needed.  Neurological: Negative for seizures, syncope and headaches.  Psychiatric/Behavioral: Negative for dysphoric mood. The patient is not nervous/anxious.     PHYSICAL EXAM: BP (!) 136/95   Pulse 80   Temp 98.3 F (36.8 C) (Oral)   Resp 16   Ht 5\' 5"  (1.651 m)   Wt 218 lb 12.8 oz (99.2 kg)    SpO2 98%   BMI 36.41 kg/m   Wt Readings from Last 3 Encounters:  01/31/18 218 lb 12.8 oz (99.2 kg)  08/31/17 209 lb (94.8 kg)  08/25/17 212 lb (96.2 kg)    Physical Exam  General appearance - alert, well appearing, and in no distress Mental status - normal mood, behavior, speech, dress, motor activity, and thought processes Eyes - pupils equal and reactive, extraocular eye movements intact Nose - normal and patent, no erythema, discharge or polyps Mouth - mucous membranes moist, pharynx normal without lesions Neck - supple, no significant adenopathy Chest -  clear to auscultation, no wheezes, rales or rhonchi, symmetric air entry Heart - normal rate, regular rhythm, normal S1, S2, no murmurs, rubs, clicks or gallops Musculoskeletal -right shoulder: Slight tenderness on palpation of the posterior joint line.  She has very good range of motion.  Drop arm test negative.  Cross body test negative. Left shoulder: No point tenderness.  Mild discomfort with cross body test.  Drop arm test negative.  Slight discomfort with passive and active elevation. Extremities - peripheral pulses normal, no pedal edema, no clubbing or cyanosis Skin -multiple skin tags around the neck.  Depression screen [facility] 2/9 01/31/2018 07/21/2017 06/09/2017  Decreased Interest 0 0 0  Down, Depressed, Hopeless 0 0 0  PHQ - 2 Score 0 0 0  Altered sleeping - - -  Tired, decreased energy - - -  Change in appetite - - -  Feeling bad or failure about yourself  - - -  Trouble concentrating - - -  Moving slowly or fidgety/restless - - -  Suicidal thoughts - - -  PHQ-9 Score - - -   GAD 7 : Generalized Anxiety Score 01/31/2018 07/21/2017 06/09/2017 05/27/2016  Nervous, Anxious, on Edge 0 0 0 0  Control/stop worrying 0 0 0 0  Worry too much - different things 0 0 0 0  Trouble relaxing 0 0 0 0  Restless 0 0 0 0  Easily annoyed or irritable 0 0 0 0  Afraid - awful might happen 0 0 0 0  Total GAD 7 Score 0 0 0 0  Anxiety  Difficulty - - - Not difficult at all      ASSESSMENT AND PLAN: 1. Physical exam, pre-employment Form completed and given to patient.  No work restrictions at this time.  2. Chronic pain of both shoulders I think she would probably benefit from some physical therapy.  However she only has family-planning Medicaid.  Advised her to apply for the orange card/cone discount.  Okay to use regular Tylenol as needed  3. Essential hypertension Not at goal but patient has not taken medicine as yet for the morning.  Continue current medication and low-salt diet.  4. Hyperlipidemia, unspecified hyperlipidemia type Continue Pravachol.  5. Class 2 obesity due to excess calories without serious comorbidity with body mass index (BMI) of 36.0 to 36.9 in adult Dietary counseling given.  Advised to eliminate all sugary drinks including lemonade.  Advised to cut back on eating white carbohydrates. Discussed ways in which she can get an exercise at home that way the weather is never an excuse  6. Need for immunization against influenza - Flu Vaccine QUAD 36+ mos IM    Patient was given the opportunity to ask questions.  Patient verbalized understanding of the plan and was able to repeat key elements of the plan.   Orders Placed This Encounter  Procedures  . Flu Vaccine QUAD 36+ mos IM     Requested Prescriptions    No prescriptions requested or ordered in this encounter    No follow-ups on file.  Jonah Blue, MD, FACP

## 2018-03-06 MED FILL — PRAVASTATIN SODIUM 20 MG TA: 20 | 30 days supply | Qty: 30 | Fill #7

## 2018-03-06 MED FILL — AMLODIPINE BESYLATE 10 MG T: 10 | 30 days supply | Qty: 30 | Fill #7

## 2018-04-07 MED FILL — AMLODIPINE BESYLATE 10 MG T: 10 | 30 days supply | Qty: 30 | Fill #8

## 2018-04-07 MED FILL — PRAVASTATIN SODIUM 20 MG TA: 20 | 30 days supply | Qty: 30 | Fill #8

## 2018-05-22 MED FILL — PRAVASTATIN SODIUM 20 MG TA: 20 | 30 days supply | Qty: 30 | Fill #9

## 2018-05-22 MED FILL — AMLODIPINE BESYLATE 10 MG T: 10 | 30 days supply | Qty: 30 | Fill #9

## 2018-06-19 ENCOUNTER — Other Ambulatory Visit: Payer: Self-pay | Admitting: Internal Medicine

## 2018-06-19 DIAGNOSIS — E785 Hyperlipidemia, unspecified: Secondary | ICD-10-CM

## 2018-06-19 DIAGNOSIS — I10 Essential (primary) hypertension: Secondary | ICD-10-CM

## 2018-07-21 ENCOUNTER — Other Ambulatory Visit: Payer: Self-pay

## 2018-07-21 ENCOUNTER — Ambulatory Visit: Payer: Self-pay | Attending: Internal Medicine | Admitting: Internal Medicine

## 2018-07-21 ENCOUNTER — Encounter: Payer: Self-pay | Admitting: Internal Medicine

## 2018-07-21 ENCOUNTER — Telehealth: Payer: Self-pay | Admitting: Internal Medicine

## 2018-07-21 DIAGNOSIS — Z6836 Body mass index (BMI) 36.0-36.9, adult: Secondary | ICD-10-CM

## 2018-07-21 DIAGNOSIS — E6609 Other obesity due to excess calories: Secondary | ICD-10-CM

## 2018-07-21 DIAGNOSIS — I10 Essential (primary) hypertension: Secondary | ICD-10-CM

## 2018-07-21 DIAGNOSIS — E785 Hyperlipidemia, unspecified: Secondary | ICD-10-CM

## 2018-07-21 DIAGNOSIS — M898X9 Other specified disorders of bone, unspecified site: Secondary | ICD-10-CM

## 2018-07-21 MED ORDER — PRAVASTATIN SODIUM 20 MG PO TABS: 20.0000 mg | ORAL_TABLET | Freq: Every day | ORAL | 1 refills | Status: DC

## 2018-07-21 MED ORDER — AMLODIPINE BESYLATE 10 MG PO TABS: 10.0000 mg | ORAL_TABLET | Freq: Every day | ORAL | 1 refills | Status: DC

## 2018-07-21 MED FILL — AMLODIPINE BESYLATE 10 MG T: 10 | 30 days supply | Qty: 30 | Fill #0

## 2018-07-21 MED FILL — PRAVASTATIN SODIUM 20 MG TA: 20 | 30 days supply | Qty: 30 | Fill #0

## 2018-07-21 NOTE — Telephone Encounter (Signed)
Attempted to reach pt and schedule a 4 month f/u

## 2018-07-21 NOTE — Progress Notes (Signed)
Virtual Visit via Telephone Note Due to current restrictions/limitations of in-office visits due to the COVID-19 pandemic, this scheduled clinical appointment was converted to a telehealth visit  I connected with Audrey Taylor on 07/21/18 at 2:20 p.m by telephone and verified that I am speaking with the correct person using two identifiers. I am in my office.  The patient is at home.  Only the patient and myself participated in this encounter.  I discussed the limitations, risks, security and privacy concerns of performing an evaluation and management service by telephone and the availability of in person appointments. I also discussed with the patient that there may be a patient responsible charge related to this service. The patient expressed understanding and agreed to proceed.  History of Present Illness: Pt with hx of HTN, obesity, stress incontinence and HL.  Purpose of today's visit is chronic disease management.  Patient last seen 01/2018   HYPERTENSION Currently taking: see medication list.  Out x 2 wks Med Adherence: [x]  Yes    []  No Medication side effects: []  Yes    [x]  No Adherence with salt restriction: [x]  Yes    []  No Home Monitoring?: []  Yes    [x]  No Monitoring Frequency: []  Yes    [x]  No Home BP results range: []  Yes    []  No SOB? []  Yes    [x]  No Chest Pain?: []  Yes    [x]  No Leg swelling?: []  Yes    [x]  No Headaches?: []  Yes    [x]  No Dizziness? []  Yes    [x]  No Comments:   HL:  Compliant with Pravachol but out x 2 wks  Obesity:  Baking her meats more.  Eating more fish, fruits veggies.   Tries to do some exercise at least every other day.  She walks up and down the steps to get to her apartment.  She also has a stationary bike that she tries to ride every other day.  She feels that her bones are brittle and wonders whether she needs to drink more milk..     Outpatient Encounter Medications as of 07/21/2018  Medication Sig  . amLODipine (NORVASC) 10 MG tablet  Take 1 tablet (10 mg total) by mouth daily. MUST MAKE APPT FOR FURTHER REFILLS  . cyclobenzaprine (FLEXERIL) 10 MG tablet Take 1 tablet (10 mg total) by mouth 3 (three) times daily as needed for muscle spasms. (Patient not taking: Reported on 05/26/2016)  . pravastatin (PRAVACHOL) 20 MG tablet Take 1 tablet (20 mg total) by mouth daily. MUST MAKE APPT FOR FURTHER REFILLS  . triamcinolone cream (KENALOG) 0.1 % Apply 1 application topically 2 (two) times daily.   No facility-administered encounter medications on file as of 07/21/2018.     Observations/Objective: No direct observation done as this was a telephone encounter  Assessment and Plan: 1. Essential hypertension Continue amlodipine and low-salt diet - amLODipine (NORVASC) 10 MG tablet; Take 1 tablet (10 mg total) by mouth daily.  Dispense: 90 tablet; Refill: 1 - CBC; Future - Comprehensive metabolic panel; Future  2. Hyperlipidemia, unspecified hyperlipidemia type - pravastatin (PRAVACHOL) 20 MG tablet; Take 1 tablet (20 mg total) by mouth daily.  Dispense: 90 tablet; Refill: 1 - Lipid panel; Future  3. Class 2 obesity due to excess calories without serious comorbidity with body mass index (BMI) of 36.0 to 36.9 in adult Commended her on changing her eating habits.  Encouraged her to limit portion sizes when she eats.  Continue regular exercise at least  3 to 4 days a week for 30 minutes.  4. Bone pain - VITAMIN D 25 Hydroxy (Vit-D Deficiency, Fractures); Future   Follow Up Instructions: Follow-up in 4 months   I discussed the assessment and treatment plan with the patient. The patient was provided an opportunity to ask questions and all were answered. The patient agreed with the plan and demonstrated an understanding of the instructions.   The patient was advised to call back or seek an in-person evaluation if the symptoms worsen or if the condition fails to improve as anticipated.  I provided 6 minutes of non-face-to-face time  during this encounter.   Jonah Blueeborah , MD

## 2018-07-21 NOTE — Telephone Encounter (Signed)
-----   Message from Jackelyn Knife, [place] sent at 07/21/2018  2:27 PM EDT ----- Please contact pt to schedule a 4 month f/u

## 2018-07-24 ENCOUNTER — Ambulatory Visit: Payer: Self-pay | Attending: Internal Medicine

## 2018-07-24 ENCOUNTER — Other Ambulatory Visit: Payer: Self-pay

## 2018-07-24 DIAGNOSIS — M898X9 Other specified disorders of bone, unspecified site: Secondary | ICD-10-CM

## 2018-07-24 DIAGNOSIS — I10 Essential (primary) hypertension: Secondary | ICD-10-CM

## 2018-07-24 DIAGNOSIS — E785 Hyperlipidemia, unspecified: Secondary | ICD-10-CM

## 2018-07-25 ENCOUNTER — Telehealth: Payer: Self-pay

## 2018-07-25 ENCOUNTER — Other Ambulatory Visit: Payer: Self-pay | Admitting: Internal Medicine

## 2018-07-25 LAB — COMPREHENSIVE METABOLIC PANEL
ALT: 29 IU/L (ref 0–32)
AST: 21 IU/L (ref 0–40)
Albumin/Globulin Ratio: 1.8 (ref 1.2–2.2)
Albumin: 4.3 g/dL (ref 3.8–4.8)
Alkaline Phosphatase: 125 IU/L — ABNORMAL HIGH (ref 39–117)
BUN/Creatinine Ratio: 11 (ref 9–23)
BUN: 11 mg/dL (ref 6–24)
Bilirubin Total: 0.3 mg/dL (ref 0.0–1.2)
CO2: 17 mmol/L — ABNORMAL LOW (ref 20–29)
Calcium: 9.3 mg/dL (ref 8.7–10.2)
Chloride: 108 mmol/L — ABNORMAL HIGH (ref 96–106)
Creatinine, Ser: 0.98 mg/dL (ref 0.57–1.00)
GFR calc Af Amer: 78 mL/min/{1.73_m2} (ref >59)
GFR calc non Af Amer: 68 mL/min/{1.73_m2} (ref >59)
Globulin, Total: 2.4 g/dL (ref 1.5–4.5)
Glucose: 117 mg/dL — ABNORMAL HIGH (ref 65–99)
Potassium: 4.3 mmol/L (ref 3.5–5.2)
Sodium: 144 mmol/L (ref 134–144)
Total Protein: 6.7 g/dL (ref 6.0–8.5)

## 2018-07-25 LAB — CBC
Hematocrit: 41.3 % (ref 34.0–46.6)
Hemoglobin: 12.9 g/dL (ref 11.1–15.9)
MCH: 27.5 pg (ref 26.6–33.0)
MCHC: 31.2 g/dL — ABNORMAL LOW (ref 31.5–35.7)
MCV: 88 fL (ref 79–97)
Platelets: 252 10*3/uL (ref 150–450)
RBC: 4.69 x10E6/uL (ref 3.77–5.28)
RDW: 16.2 % — ABNORMAL HIGH (ref 11.7–15.4)
WBC: 7 10*3/uL (ref 3.4–10.8)

## 2018-07-25 LAB — LIPID PANEL
Chol/HDL Ratio: 5 ratio — ABNORMAL HIGH (ref 0.0–4.4)
Cholesterol, Total: 221 mg/dL — ABNORMAL HIGH (ref 100–199)
HDL: 44 mg/dL (ref >39)
LDL Calculated: 120 mg/dL — ABNORMAL HIGH (ref 0–99)
Triglycerides: 284 mg/dL — ABNORMAL HIGH (ref 0–149)
VLDL Cholesterol Cal: 57 mg/dL — ABNORMAL HIGH (ref 5–40)

## 2018-07-25 LAB — VITAMIN D 25 HYDROXY (VIT D DEFICIENCY, FRACTURES): Vit D, 25-Hydroxy: 6.1 ng/mL — ABNORMAL LOW (ref 30.0–100.0)

## 2018-07-25 MED ORDER — VITAMIN D (ERGOCALCIFEROL) 1.25 MG (50000 UNIT) PO CAPS: 50000.0000 [IU] | ORAL_CAPSULE | ORAL | 1 refills | Status: AC

## 2018-07-25 MED FILL — VIT D2 1.25 MG (50,000 UNIT: 1.25 MG | 28 days supply | Qty: 4 | Fill #0

## 2018-07-25 NOTE — Telephone Encounter (Signed)
Returned pt call and went over lab results pt is aware   Dr. Wynetta Emery pt states she hasn't had her pravastatin in a month. Would you like pt to continue same dose or increase  Also pt states she did eat a little something before her blood work.

## 2018-07-25 NOTE — Telephone Encounter (Signed)
Pt returned call for labs 

## 2018-07-25 NOTE — Telephone Encounter (Signed)
Contacted pt to go over lab results pt didn't answer lvm asking pt to give me a call at her earliest convenience  

## 2018-07-26 NOTE — Telephone Encounter (Signed)
Contacted pt and went over Dr. Newlin response pt doesn't have any questions or concerns 

## 2018-07-26 NOTE — Telephone Encounter (Signed)
If she had been out of pravastatin that could explain abnormal lipid panel.  Advised to continue current dose of pravastatin along with low-cholesterol diet and exercise.

## 2018-08-01 ENCOUNTER — Other Ambulatory Visit (HOSPITAL_COMMUNITY): Payer: Self-pay | Admitting: *Deleted

## 2018-08-01 DIAGNOSIS — Z1231 Encounter for screening mammogram for malignant neoplasm of breast: Secondary | ICD-10-CM

## 2018-08-30 MED FILL — AMLODIPINE BESYLATE 10 MG T: 10 | 30 days supply | Qty: 30 | Fill #1

## 2018-08-30 MED FILL — PRAVASTATIN SODIUM 20 MG TA: 20 | 30 days supply | Qty: 30 | Fill #1

## 2018-10-06 MED FILL — PRAVASTATIN SODIUM 20 MG TA: 20 | 30 days supply | Qty: 30 | Fill #2

## 2018-10-06 MED FILL — AMLODIPINE BESYLATE 10 MG T: 10 | 30 days supply | Qty: 30 | Fill #2

## 2018-10-19 ENCOUNTER — Ambulatory Visit (HOSPITAL_COMMUNITY)
Admission: RE | Admit: 2018-10-19 | Discharge: 2018-10-19 | Disposition: A | Discharge status: Home / Self Care | Payer: Medicaid Other | Source: Ambulatory Visit | Attending: Obstetrics and Gynecology | Admitting: Obstetrics and Gynecology

## 2018-10-19 ENCOUNTER — Encounter (HOSPITAL_COMMUNITY): Payer: Self-pay

## 2018-10-19 ENCOUNTER — Ambulatory Visit
Admission: RE | Admit: 2018-10-19 | Discharge: 2018-10-19 | Disposition: A | Discharge status: Home / Self Care | Payer: Medicaid Other | Source: Ambulatory Visit | Attending: Obstetrics and Gynecology | Admitting: Obstetrics and Gynecology

## 2018-10-19 ENCOUNTER — Other Ambulatory Visit: Payer: Self-pay

## 2018-10-19 DIAGNOSIS — Z1231 Encounter for screening mammogram for malignant neoplasm of breast: Secondary | ICD-10-CM

## 2018-10-19 DIAGNOSIS — Z1239 Encounter for other screening for malignant neoplasm of breast: Secondary | ICD-10-CM

## 2018-10-19 NOTE — Patient Instructions (Signed)
Explained breast self awareness with Murvin Donning. Patient did not need a Pap smear today due to last Pap smear and HPV typing was 06/09/2017. Let her know B[facility]CP will cover Pap smears and HPV typing every 5 years unless has a history of abnormal Pap smears. Referred patient to the [facility] for a screening mammogram. Appointment scheduled for Thursday, October 19, 2018 at 1700. Patient aware of appointment and will be there. Let patient know the [facility] will follow up with her within the next couple weeks with results of mammogram by letter or phone. Audrey Taylor verbalized understanding.  Brannock, Arvil Chaco, RN 3:12 PM

## 2018-10-19 NOTE — Progress Notes (Signed)
No complaints today.   Pap Smear: Pap smear not completed today. Last Pap smear was 06/09/2017 at [facility] and normal with negative HPV. Per patient has a history of an abnormal Pap smear 25 years ago that a repeat Pap smear was completed for follow-up. Per patient all Pap smears have been normal since repeat Pap smear and has had more than three Pap smears. Last two Pap smear results are in Epic.  Physical exam: Breasts Breasts symmetrical. No skin abnormalities bilateral breasts. No nipple retraction bilateral breasts. No nipple discharge bilateral breasts. No lymphadenopathy. No lumps palpated bilateral breasts. No complaints of pain or tenderness on exam. Referred patient to the [facility] for a screening mammogram. Appointment scheduled for Thursday, October 19, 2018 at 1700.        Pelvic/Bimanual No Pap smear completed today since last Pap smear and HPV typing was 06/09/2017. Pap smear not indicated per BCCCP guidelines.   Smoking History: Patient has never smoked.  Patient Navigation: Patient education provided. Access to services provided for patient through [facility] program.   Colorectal Cancer Screening: Per patient has never had a colonoscopy completed. No complaints today.   Breast and Cervical Cancer Risk Assessment: Patient has a family history of her mother having breast cancer. Patient has no known genetic mutations or history of radiation treatment to the chest before age 28. Patient has no history of cervical dysplasia, immunocompromised, or DES exposure in-utero.  Risk Assessment    Risk Scores      10/19/2018 08/25/2017   Last edited by: Loletta Parish, RN Armond Hang, LPN   5-year risk: 1.8 % 1.7 %   Lifetime risk: 13.5 % 13.9 %

## 2018-11-03 MED FILL — AMLODIPINE BESYLATE 10 MG T: 10 | 30 days supply | Qty: 30 | Fill #3

## 2018-11-03 MED FILL — PRAVASTATIN SODIUM 20 MG TA: 20 | 30 days supply | Qty: 30 | Fill #3

## 2018-12-07 MED FILL — AMLODIPINE BESYLATE 10 MG T: 10 | 30 days supply | Qty: 30 | Fill #4

## 2018-12-07 MED FILL — PRAVASTATIN SODIUM 20 MG TA: 20 | 30 days supply | Qty: 30 | Fill #4

## 2019-01-08 MED FILL — AMLODIPINE BESYLATE 10 MG T: 10 | 30 days supply | Qty: 30 | Fill #5

## 2019-01-08 MED FILL — PRAVASTATIN SODIUM 20 MG TA: 20 | 30 days supply | Qty: 30 | Fill #5

## 2019-02-12 MED FILL — AMLODIPINE BESYLATE 10 MG T: 10 | 30 days supply | Qty: 30 | Fill #0

## 2019-02-12 MED FILL — PRAVASTATIN SODIUM 20 MG TA: 20 | 30 days supply | Qty: 30 | Fill #0

## 2019-03-07 MED FILL — PRAVASTATIN SODIUM 20 MG TA: 20 | 30 days supply | Qty: 30 | Fill #0

## 2019-03-07 MED FILL — AMLODIPINE BESYLATE 10 MG T: 10 | 30 days supply | Qty: 30 | Fill #0

## 2019-04-02 ENCOUNTER — Other Ambulatory Visit: Payer: Self-pay | Admitting: Internal Medicine

## 2019-04-02 DIAGNOSIS — I10 Essential (primary) hypertension: Secondary | ICD-10-CM

## 2019-04-02 DIAGNOSIS — E785 Hyperlipidemia, unspecified: Secondary | ICD-10-CM

## 2019-04-12 ENCOUNTER — Other Ambulatory Visit: Payer: Self-pay | Admitting: Internal Medicine

## 2019-04-12 DIAGNOSIS — I10 Essential (primary) hypertension: Secondary | ICD-10-CM

## 2019-04-12 DIAGNOSIS — E785 Hyperlipidemia, unspecified: Secondary | ICD-10-CM

## 2019-05-01 ENCOUNTER — Ambulatory Visit: Payer: BC Managed Care – PPO | Attending: Internal Medicine | Admitting: Internal Medicine

## 2019-05-01 ENCOUNTER — Encounter: Payer: Self-pay | Admitting: Internal Medicine

## 2019-05-01 ENCOUNTER — Other Ambulatory Visit: Payer: Self-pay

## 2019-05-01 DIAGNOSIS — E785 Hyperlipidemia, unspecified: Secondary | ICD-10-CM

## 2019-05-01 DIAGNOSIS — Z1211 Encounter for screening for malignant neoplasm of colon: Secondary | ICD-10-CM

## 2019-05-01 DIAGNOSIS — I10 Essential (primary) hypertension: Secondary | ICD-10-CM | POA: Diagnosis not present

## 2019-05-01 DIAGNOSIS — M79672 Pain in left foot: Secondary | ICD-10-CM

## 2019-05-01 DIAGNOSIS — E559 Vitamin D deficiency, unspecified: Secondary | ICD-10-CM

## 2019-05-01 DIAGNOSIS — M25671 Stiffness of right ankle, not elsewhere classified: Secondary | ICD-10-CM

## 2019-05-01 DIAGNOSIS — M25672 Stiffness of left ankle, not elsewhere classified: Secondary | ICD-10-CM

## 2019-05-01 DIAGNOSIS — E6609 Other obesity due to excess calories: Secondary | ICD-10-CM | POA: Diagnosis not present

## 2019-05-01 DIAGNOSIS — Z6836 Body mass index (BMI) 36.0-36.9, adult: Secondary | ICD-10-CM

## 2019-05-01 DIAGNOSIS — M79671 Pain in right foot: Secondary | ICD-10-CM

## 2019-05-01 MED ORDER — AMLODIPINE BESYLATE 10 MG PO TABS: 10.0000 mg | ORAL_TABLET | Freq: Every day | ORAL | 1 refills | Status: AC

## 2019-05-01 MED ORDER — PRAVASTATIN SODIUM 20 MG PO TABS: 20.0000 mg | ORAL_TABLET | Freq: Every day | ORAL | 1 refills | Status: AC

## 2019-05-01 MED FILL — AMLODIPINE BESYLATE 10 MG T: 10 | 30 days supply | Qty: 30 | Fill #0

## 2019-05-01 MED FILL — PRAVASTATIN SODIUM 20 MG TA: 20 | 30 days supply | Qty: 30 | Fill #0

## 2019-05-01 NOTE — Progress Notes (Signed)
Virtual Visit via Telephone Note Due to current restrictions/limitations of in-office visits due to the COVID-19 pandemic, this scheduled clinical appointment was converted to a telehealth visit  I connected with Audrey Taylor on 05/01/19 at 11:02 a.m by telephone and verified that I am speaking with the correct person using two identifiers. I am in my office.  The patient is at home.  Only the patient and myself participated in this encounter.  I discussed the limitations, risks, security and privacy concerns of performing an evaluation and management service by telephone and the availability of in person appointments. I also discussed with the patient that there may be a patient responsible charge related to this service. The patient expressed understanding and agreed to proceed.   History of Present Illness: Pt with hx of HTN, obesity, stress incontinence, Vit D def, and HL.  Last evaluated 07/2018.   HYPERTENSION Currently taking: Amlodipine which she has been out of for 2 weeks.  Told that she had to be seen prior to further refills.   Med Adherence: [x]  Yes    []  No Medication side effects: []  Yes    [x]  No Adherence with salt restriction: [x]  Yes.  She is using a salt substitute    []  No Home Monitoring?: []  Yes    [x]  No Monitoring Frequency: []  Yes    []  No Home BP results range: []  Yes    []  No SOB? []  Yes    [x]  No Chest Pain?: []  Yes    [x]  No Leg swelling?: []  Yes    [x]  No Headaches?: []  Yes    [x]  No Dizziness? []  Yes    [x]  No Comments:   HL:  Out of Pravachol x 2 wks.  Waiting on refill from me.  She is tolerating the medication.  On last lipid profile she was not at goal but at that time she had been out of Pravachol for a month.  Obesity: walk a lot at work and in the evenings for 1 hr with her sister. She also reports that she is doing better with her eating habits.  Baking or grilling meats.  Stopped drinking sodas and sugar. Drinks juices.  Eating more fruits and  veggies.    C/o weak ankles.  She notes that in the evenings after work if she sits down for greater than 15 minutes then get up, the ankles are very stiff and painful.  Also reports pain on the sole of both feet especially when she wears tennis shoes.  She has started wearing crocs at work with much relief.   She has some questions about vitamin supplements.  She tells me that she sometimes drinks gingerroot and turmeric tea.  Wanting to know what is a good multivitamin that she can take.  She is also asking about a brain supplement that she is seen being advertised. Blood test done on last visit revealed that she had vitamin D deficiency.  I had sent the prescription to the pharmacy for high-dose vitamin D but patient states that the pharmacy never sent it to her.  HM: She is due for colon cancer screening  Outpatient Encounter Medications as of 05/01/2019  Medication Sig  . amLODipine (NORVASC) 10 MG tablet Take 1 tablet (10 mg total) by mouth daily.  . cyclobenzaprine (FLEXERIL) 10 MG tablet Take 1 tablet (10 mg total) by mouth 3 (three) times daily as needed for muscle spasms. (Patient not taking: Reported on 05/26/2016)  . pravastatin (PRAVACHOL) 20 MG  tablet Take 1 tablet (20 mg total) by mouth daily.  Marland Kitchen triamcinolone cream (KENALOG) 0.1 % Apply 1 application topically 2 (two) times daily. (Patient not taking: Reported on 05/01/2019)  . Vitamin D, Ergocalciferol, (DRISDOL) 1.25 MG (50000 UT) CAPS capsule Take 1 capsule (50,000 Units total) by mouth every 7 (seven) days.   No facility-administered encounter medications on file as of 05/01/2019.    Observations/Objective: Results for orders placed or performed in visit on 07/24/18  VITAMIN D 25 Hydroxy (Vit-D Deficiency, Fractures)  Result Value Ref Range   Vit D, 25-Hydroxy 6.1 (L) 30.0 - 100.0 ng/mL  Lipid panel  Result Value Ref Range   Cholesterol, Total 221 (H) 100 - 199 mg/dL   Triglycerides 035 (H) 0 - 149 mg/dL   HDL 44 >46  mg/dL   VLDL Cholesterol Cal 57 (H) 5 - 40 mg/dL   LDL Calculated 568 (H) 0 - 99 mg/dL   Chol/HDL Ratio 5.0 (H) 0.0 - 4.4 ratio  Comprehensive metabolic panel  Result Value Ref Range   Glucose 117 (H) 65 - 99 mg/dL   BUN 11 6 - 24 mg/dL   Creatinine, Ser 1.27 0.57 - 1.00 mg/dL   GFR calc non Af Amer 68 >59 mL/min/1.73   GFR calc Af Amer 78 >59 mL/min/1.73   BUN/Creatinine Ratio 11 9 - 23   Sodium 144 134 - 144 mmol/L   Potassium 4.3 3.5 - 5.2 mmol/L   Chloride 108 (H) 96 - 106 mmol/L   CO2 17 (L) 20 - 29 mmol/L   Calcium 9.3 8.7 - 10.2 mg/dL   Total Protein 6.7 6.0 - 8.5 g/dL   Albumin 4.3 3.8 - 4.8 g/dL   Globulin, Total 2.4 1.5 - 4.5 g/dL   Albumin/Globulin Ratio 1.8 1.2 - 2.2   Bilirubin Total 0.3 0.0 - 1.2 mg/dL   Alkaline Phosphatase 125 (H) 39 - 117 IU/L   AST 21 0 - 40 IU/L   ALT 29 0 - 32 IU/L  CBC  Result Value Ref Range   WBC 7.0 3.4 - 10.8 x10E3/uL   RBC 4.69 3.77 - 5.28 x10E6/uL   Hemoglobin 12.9 11.1 - 15.9 g/dL   Hematocrit 51.7 00.1 - 46.6 %   MCV 88 79 - 97 fL   MCH 27.5 26.6 - 33.0 pg   MCHC 31.2 (L) 31.5 - 35.7 g/dL   RDW 74.9 (H) 44.9 - 67.5 %   Platelets 252 150 - 450 x10E3/uL     Assessment and Plan: 1. Essential hypertension Level of control unknown.  Refill given on amlodipine.  Continue low-salt diet.  She will follow up with the clinical pharmacist in 2 weeks for blood pressure check - amLODipine (NORVASC) 10 MG tablet; Take 1 tablet (10 mg total) by mouth daily.  Dispense: 90 tablet; Refill: 1  2. Hyperlipidemia, unspecified hyperlipidemia type - pravastatin (PRAVACHOL) 20 MG tablet; Take 1 tablet (20 mg total) by mouth daily.  Dispense: 90 tablet; Refill: 1  3. Class 2 obesity due to excess calories without serious comorbidity with body mass index (BMI) of 36.0 to 36.9 in adult Commended her and encouraged her to continue regular exercise.  Commended her on making changes in her eating habits.  Advised that she try to eliminate sugary drinks  including juices. - Hemoglobin A1c; Future  4. Screening for colon cancer Discussed colon cancer screening methods.  She prefers to have colonoscopy. - Ambulatory referral to Gastroenterology  5. Vitamin D deficiency She will come to the  lab to have vitamin D level checked to see whether she still needs to be on high-dose vitamin D. - VITAMIN D 25 Hydroxy (Vit-D Deficiency, Fractures); Future  6. Joint stiffness of both ankles Sounds like arthritis.  I recommend taking Tylenol or ibuprofen as needed  7. Pain in both feet Patient reports that the issue has resolved with her wearing crocs shoes at work  In terms of vitamins, I told her that if she wants to take a One-A-Day woman's multivitamin tablet that is okay with me.  I do not think the brain supplement is necessary.  Follow Up Instructions: 4 mths   I discussed the assessment and treatment plan with the patient. The patient was provided an opportunity to ask questions and all were answered. The patient agreed with the plan and demonstrated an understanding of the instructions.   The patient was advised to call back or seek an in-person evaluation if the symptoms worsen or if the condition fails to improve as anticipated.  I provided 17 minutes of non-face-to-face time during this encounter.   Jonah Blue, MD

## 2019-05-02 MED FILL — VIT D2 1.25 MG (50,000 UNIT: 1.25 MG | 28 days supply | Qty: 4 | Fill #1

## 2019-05-16 ENCOUNTER — Ambulatory Visit: Payer: BC Managed Care – PPO | Admitting: Pharmacist

## 2019-05-22 ENCOUNTER — Ambulatory Visit: Payer: BC Managed Care – PPO | Attending: Internal Medicine | Admitting: Pharmacist

## 2019-05-22 ENCOUNTER — Other Ambulatory Visit: Payer: Self-pay

## 2019-05-22 VITALS — BP 120/84 | HR 84

## 2019-05-22 DIAGNOSIS — I10 Essential (primary) hypertension: Secondary | ICD-10-CM | POA: Diagnosis not present

## 2019-05-22 NOTE — Progress Notes (Signed)
   S:    PCP: Dr. Laural Benes  Patient arrives in good spirits. Presents to the clinic for hypertension evaluation, counseling, and management.  Patient was referred and last seen by Primary Care Provider on 05/01/2019.    Medication adherence: reports.  Current BP Medications include:  Amlodipine 10 mg daily   Dietary habits include:  Compliant with salt restriction; denies caffeine intake  Exercise habits include: walks daily Family / Social history:  Fhx: HTN Tobacco: never smoker Alcohol: rare use  O:  Vitals:   05/22/19 1609  BP: 120/84  Pulse: 84   Home BP readings: none  Last 3 Office BP readings: BP Readings from Last 3 Encounters:  10/19/18 128/88  01/31/18 (!) 136/95  08/31/17 120/70   BMET    Component Value Date/Time   NA 144 07/24/2018 1526   K 4.3 07/24/2018 1526   CL 108 (H) 07/24/2018 1526   CO2 17 (L) 07/24/2018 1526   GLUCOSE 117 (H) 07/24/2018 1526   GLUCOSE 125 (H) 04/18/2016 2023   BUN 11 07/24/2018 1526   CREATININE 0.98 07/24/2018 1526   CREATININE 0.84 04/03/2015 1000   CALCIUM 9.3 07/24/2018 1526   GFRNONAA 68 07/24/2018 1526   GFRNONAA 78 05/29/2013 0957   GFRAA 78 07/24/2018 1526   GFRAA >89 05/29/2013 0957    Renal function: CrCl cannot be calculated (Patient's most recent lab result is older than the maximum 21 days allowed.).  Clinical ASCVD: No  The 10-year ASCVD risk score Denman George DC Jr., et al., 2013) is: 5.1%   Values used to calculate the score:     Age: 26 years     Sex: Female     Is Non-Hispanic African American: Yes     Diabetic: No     Tobacco smoker: No     Systolic Blood Pressure: 128 mmHg     Is BP treated: Yes     HDL Cholesterol: 44 mg/dL     Total Cholesterol: 221 mg/dL  A/P: Hypertension longstanding currently close to goal on current medications. BP Goal = < 130/80 mmHg. Medication adherence: reports.  -Continued current regimen.  -Counseled on lifestyle modifications for blood pressure control including  reduced dietary sodium, increased exercise, adequate sleep.  Results reviewed and written information provided.   Total time in face-to-face counseling 15 minutes.   F/U Clinic Visit in August with PCP.   Butch Penny, PharmD, CPP Clinical Pharmacist [facility] [phone]

## 2019-05-23 ENCOUNTER — Encounter: Payer: Self-pay | Admitting: Pharmacist

## 2019-05-31 MED FILL — VIT D2 1.25 MG (50,000 UNIT: 1.25 MG | 28 days supply | Qty: 4 | Fill #2

## 2019-05-31 MED FILL — AMLODIPINE BESYLATE 10 MG T: 10 | 30 days supply | Qty: 30 | Fill #1

## 2019-05-31 MED FILL — PRAVASTATIN SODIUM 20 MG TA: 20 | 30 days supply | Qty: 30 | Fill #1

## 2019-07-19 ENCOUNTER — Encounter: Payer: Self-pay | Admitting: Internal Medicine

## 2019-08-07 MED FILL — PRAVASTATIN SODIUM 20 MG TA: 20 | 30 days supply | Qty: 30 | Fill #2

## 2019-08-07 MED FILL — AMLODIPINE BESYLATE 10 MG T: 10 | 30 days supply | Qty: 30 | Fill #2

## 2019-08-31 ENCOUNTER — Ambulatory Visit: Payer: BC Managed Care – PPO | Admitting: Internal Medicine

## 2019-09-28 MED FILL — AMLODIPINE BESYLATE 10 MG T: 10 | 30 days supply | Qty: 30 | Fill #3

## 2019-09-28 MED FILL — PRAVASTATIN SODIUM 20 MG TA: 20 | 30 days supply | Qty: 30 | Fill #3

## 2020-01-14 ENCOUNTER — Other Ambulatory Visit: Payer: Self-pay | Admitting: Internal Medicine

## 2020-01-14 DIAGNOSIS — Z1231 Encounter for screening mammogram for malignant neoplasm of breast: Secondary | ICD-10-CM

## 2020-02-06 MED FILL — AMLODIPINE BESYLATE 10 MG T: 10 | 30 days supply | Qty: 30 | Fill #4

## 2020-02-06 MED FILL — PRAVASTATIN SODIUM 20 MG TA: 20 | 30 days supply | Qty: 30 | Fill #4

## 2020-02-26 ENCOUNTER — Ambulatory Visit
Admission: RE | Admit: 2020-02-26 | Discharge: 2020-02-26 | Disposition: A | Discharge status: Home / Self Care | Payer: BC Managed Care – PPO | Source: Ambulatory Visit | Attending: Internal Medicine | Admitting: Internal Medicine

## 2020-02-26 ENCOUNTER — Other Ambulatory Visit: Payer: Self-pay

## 2020-02-26 DIAGNOSIS — Z1231 Encounter for screening mammogram for malignant neoplasm of breast: Secondary | ICD-10-CM
